# Patient Record
Sex: Female | Born: 1970
Health system: Southern US, Community
[De-identification: ages and names within clinical notes are randomized; demographics above are authoritative.]

## PROBLEM LIST (undated history)

## (undated) DIAGNOSIS — E079 Disorder of thyroid, unspecified: Secondary | ICD-10-CM

## (undated) DIAGNOSIS — M1990 Unspecified osteoarthritis, unspecified site: Secondary | ICD-10-CM

## (undated) DIAGNOSIS — I10 Essential (primary) hypertension: Secondary | ICD-10-CM

## (undated) HISTORY — PX: TONSILLECTOMY AND ADENOIDECTOMY: SUR1326

## (undated) HISTORY — PX: CHOLECYSTECTOMY: SHX55

## (undated) HISTORY — DX: Unspecified osteoarthritis, unspecified site: M19.90

## (undated) HISTORY — DX: Disorder of thyroid, unspecified: E07.9

---

## 2013-12-01 ENCOUNTER — Ambulatory Visit (INDEPENDENT_AMBULATORY_CARE_PROVIDER_SITE_OTHER): Payer: 59 | Admitting: Physician Assistant

## 2013-12-01 ENCOUNTER — Ambulatory Visit: Payer: 59

## 2013-12-01 VITALS — BP 156/88 | HR 67 | Temp 98.7°F | Resp 18 | Ht 63.5 in | Wt 304.0 lb

## 2013-12-01 DIAGNOSIS — R05 Cough: Secondary | ICD-10-CM

## 2013-12-01 DIAGNOSIS — R059 Cough, unspecified: Secondary | ICD-10-CM

## 2013-12-01 DIAGNOSIS — E039 Hypothyroidism, unspecified: Secondary | ICD-10-CM | POA: Insufficient documentation

## 2013-12-01 DIAGNOSIS — R062 Wheezing: Secondary | ICD-10-CM

## 2013-12-01 DIAGNOSIS — J329 Chronic sinusitis, unspecified: Secondary | ICD-10-CM

## 2013-12-01 IMAGING — CR DG CHEST 2V
3 series · 3 of 3 positions shown · non-contrast
Comparison: None.

CLINICAL DATA: Chest tightness

EXAM:
CHEST  2 VIEW

[PA (1 of 2)]
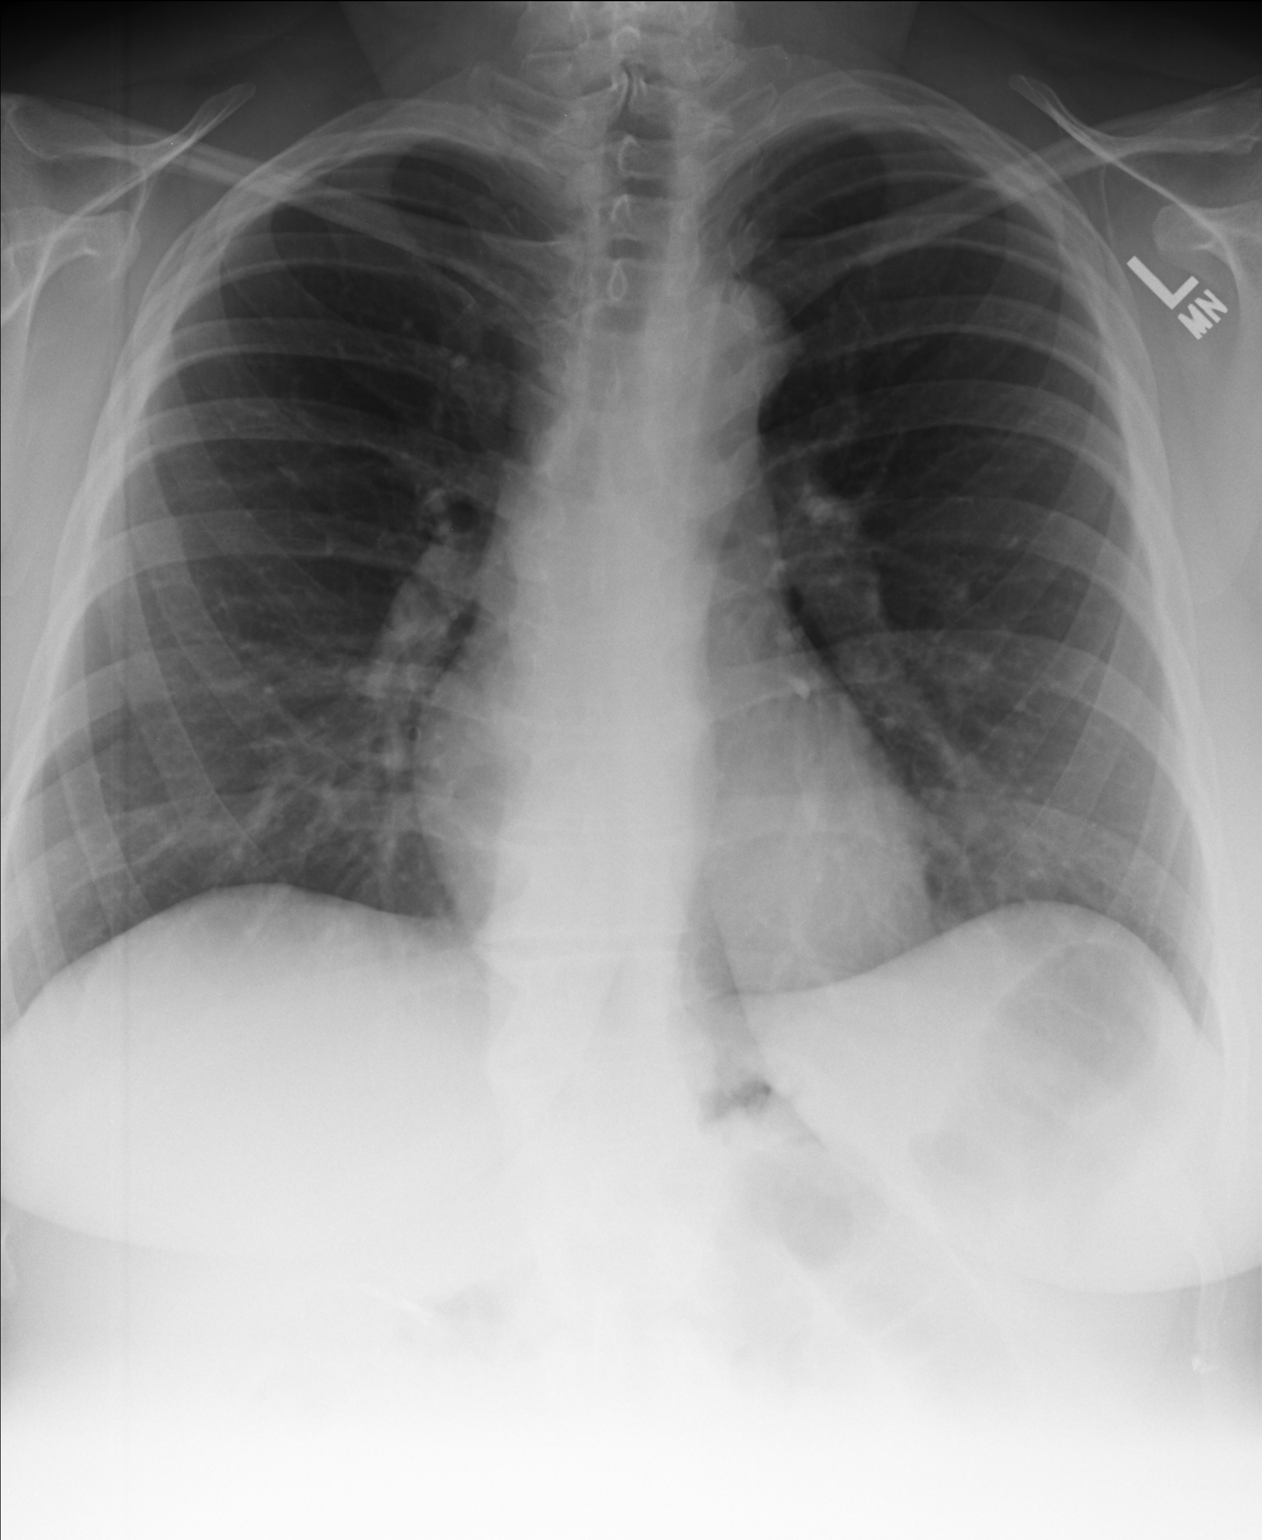

[lateral]
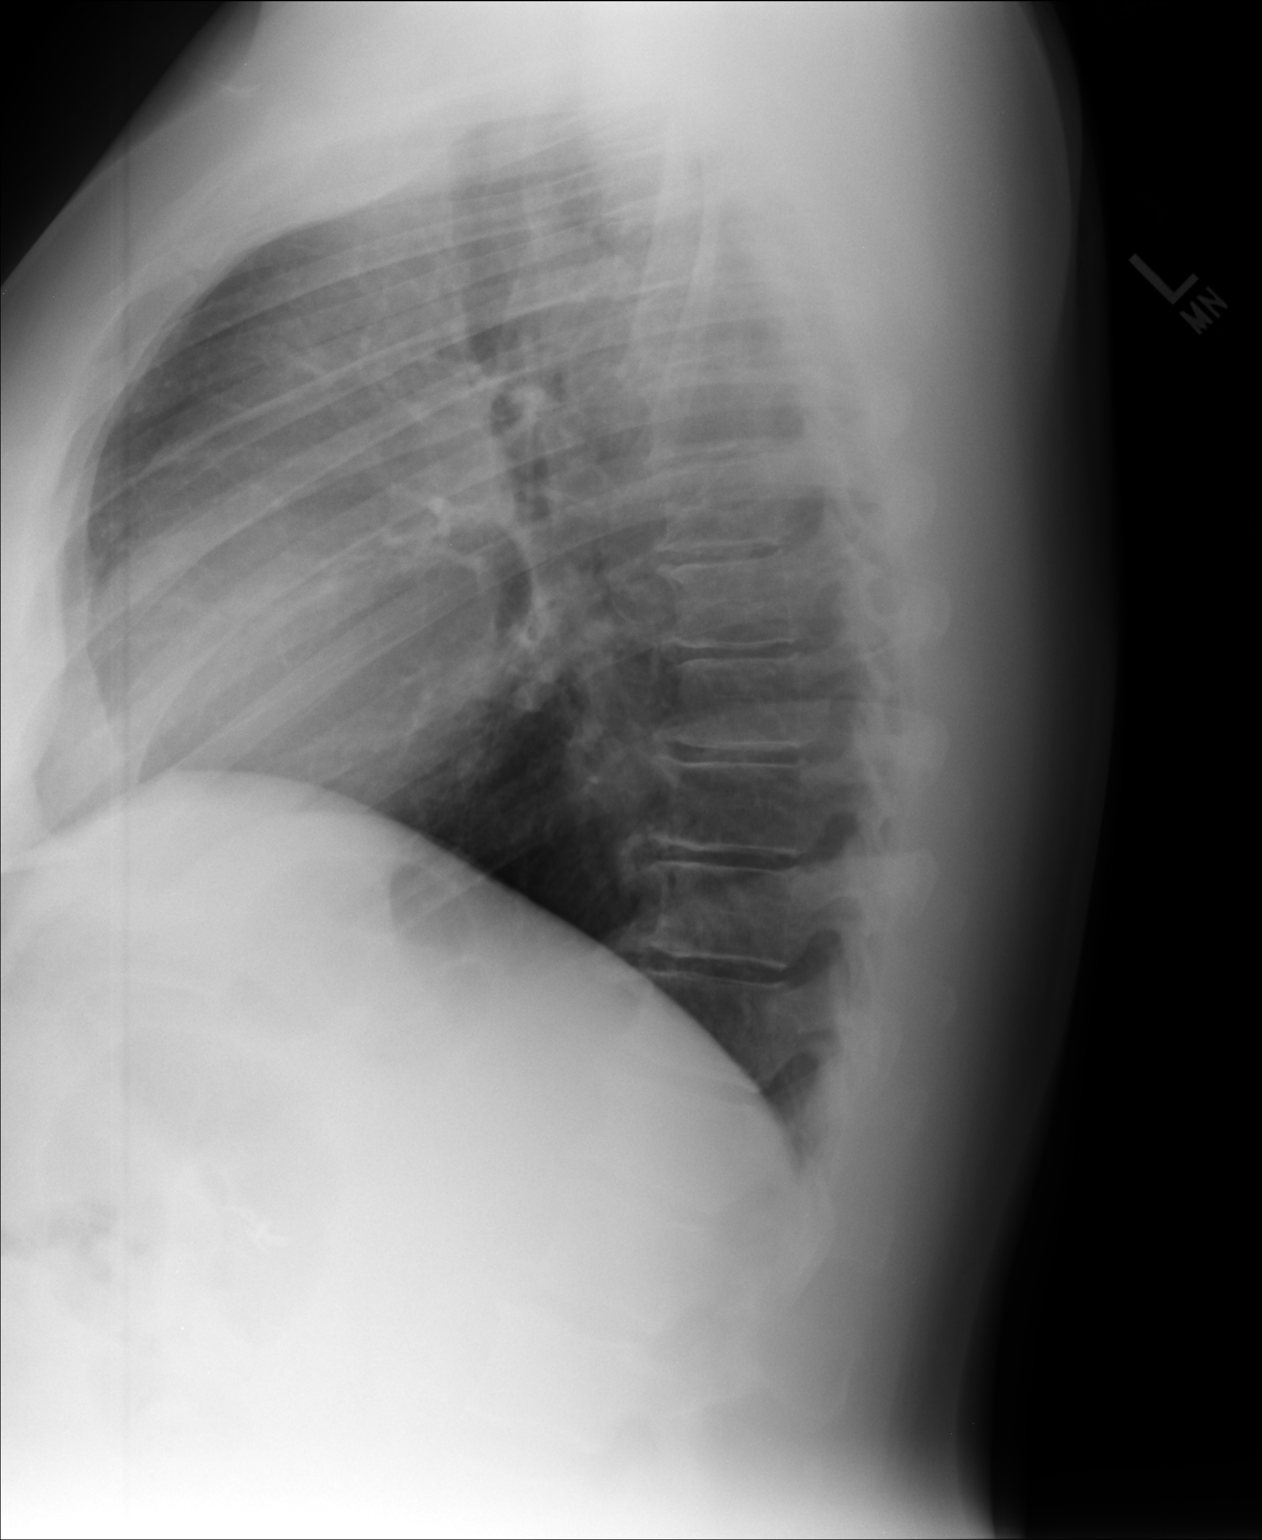

[PA (2 of 2)]
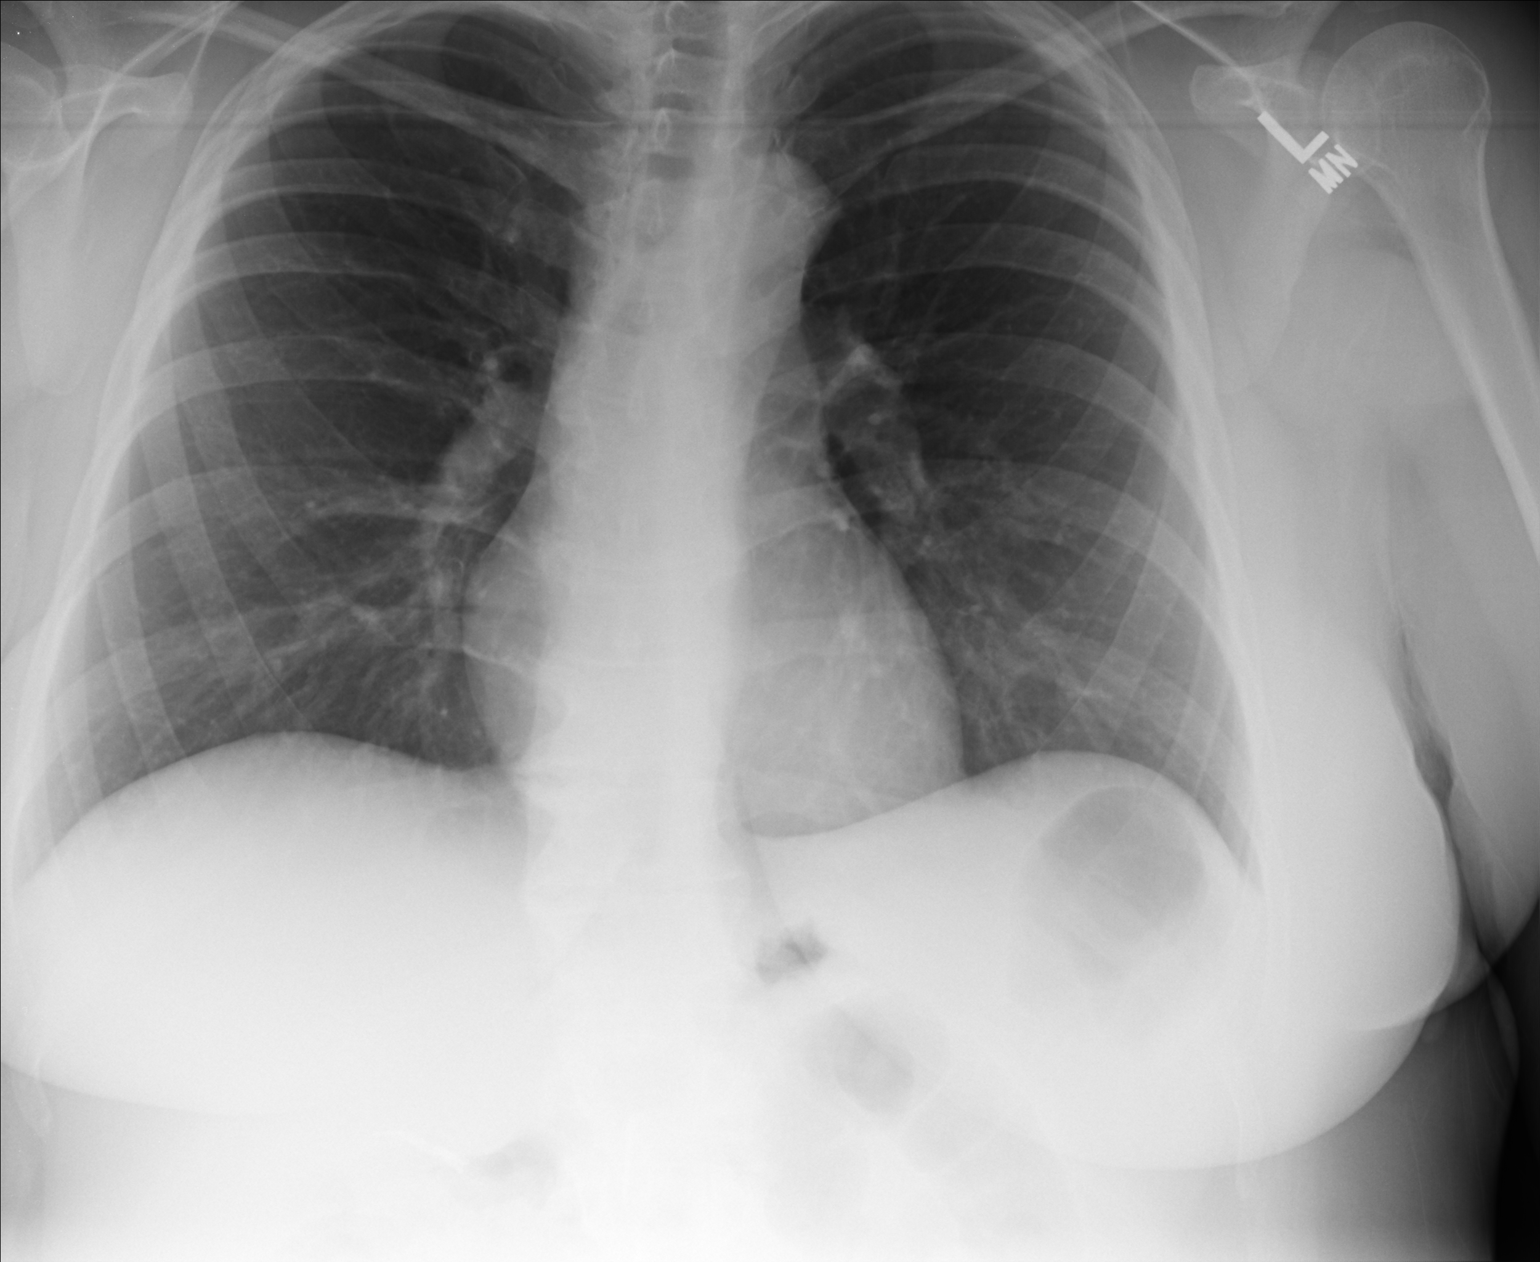

[3 of 3 positions shown; findings below may reference images not displayed]

FINDINGS: The heart size and mediastinal contours are within normal limits.
Both lungs are clear. The visualized skeletal structures are
unremarkable. Prior cholecystectomy.
IMPRESSION: No active cardiopulmonary disease.

## 2013-12-01 MED ORDER — MOXIFLOXACIN HCL 400 MG PO TABS: 400.0000 mg | ORAL_TABLET | Freq: Every day | ORAL | Status: DC

## 2013-12-01 MED ORDER — ALBUTEROL SULFATE (2.5 MG/3ML) 0.083% IN NEBU
2.5000 mg | INHALATION_SOLUTION | Freq: Once | RESPIRATORY_TRACT | Status: AC
  Administered 2013-12-01: 2.5 mg via RESPIRATORY_TRACT

## 2013-12-01 MED ORDER — HYDROCOD POLST-CHLORPHEN POLST 10-8 MG/5ML PO LQCR
5.0000 mL | Freq: Two times a day (BID) | ORAL | Status: DC | PRN
Start: 2013-12-01 — End: 2015-04-23

## 2013-12-01 MED ORDER — IPRATROPIUM BROMIDE 0.02 % IN SOLN
0.5000 mg | Freq: Once | RESPIRATORY_TRACT | Status: AC
  Administered 2013-12-01: 0.5 mg via RESPIRATORY_TRACT

## 2013-12-01 MED ORDER — ALBUTEROL SULFATE HFA 108 (90 BASE) MCG/ACT IN AERS
2.0000 | INHALATION_SPRAY | RESPIRATORY_TRACT | Status: DC | PRN
Start: 2013-12-01 — End: 2013-12-01

## 2013-12-01 MED ORDER — ALBUTEROL SULFATE HFA 108 (90 BASE) MCG/ACT IN AERS: 2.0000 | INHALATION_SPRAY | RESPIRATORY_TRACT | Status: DC | PRN

## 2013-12-01 NOTE — Progress Notes (Signed)
Subjective:    Patient ID: Emily Craig, female    DOB: May 17, 1971, 43 y.o.   MRN: [id number]  PCP: No PCP Per Patient  Chief Complaint  Patient presents with  . Sinusitis    around new years was given z pack but never got better   . Cough  . Otalgia  . chest congestion  . Sore Throat  . Headache     Active Ambulatory Problems    Diagnosis Date Noted  . Hypothyroidism 12/01/2013   Resolved Ambulatory Problems    Diagnosis Date Noted  . No Resolved Ambulatory Problems   Past Medical History  Diagnosis Date  . Thyroid disease     Past Surgical History  Procedure Laterality Date  . Cholecystectomy      Allergies  Allergen Reactions  . Penicillins     Unknown reaction  . Sulfa Antibiotics Rash    Prior to Admission medications   Medication Sig Start Date End Date Taking? Authorizing Provider  levothyroxine (SYNTHROID, LEVOTHROID) 175 MCG tablet Take 175 mcg by mouth daily before breakfast.   Yes Historical Provider, MD    History   Social History  . Marital Status: Married    Spouse Name: [name]    Number of Children: 3  . Years of Education: N/A   Occupational History  . 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 Smoking status: Never Smoker   . Smokeless tobacco: Never Used  . Alcohol Use: No  . Drug Use: No  . Sexual Activity: None   Other Topics Concern  . None   Social History Narrative   Lives with her husband and their daughter.  Their sons (twins) stayed in [place] when they moved here June 2014.    family history includes Aortic aneurysm in her mother; Cancer in her maternal grandfather; Diabetes in her maternal grandmother; Heart disease in her father and maternal grandmother; Hyperlipidemia in her brother; Hypertension in her brother and mother; Stroke in her paternal grandmother. indicated that her mother is alive. She indicated that her father is deceased. She indicated that her brother is alive. She  indicated that her maternal grandmother is deceased. She indicated that her maternal grandfather is deceased. She indicated that her paternal grandmother is deceased. She indicated that her paternal grandfather is deceased. She indicated that her daughter is alive. She indicated that both of her sons are alive.   HPI  As above.  Was visiting in [place] when developed a cold.  On 11/10/2013 clearly had a sinusitis, and was prescribed a Zpak.  She started feeling better, thgouh she was still tired.  The following week, she developed ear pain and sore throat, which resolved spontaneously without treatment.  Started to feel bad again 5 days ago.  Feels like a bronchospasm.  Causes sore throat.  Non-productive.  At times, can hear wheezing. Ears hurt.  Trouble sleeping due to cough. Now also has facial pain again.  Minimal rhinorrhea, clear.  Some laryngitis today and yesterday.  Headache. Some chills initially.  None since, and no fever. No GI/GU symptoms.   Review of Systems As above.    Objective:   Physical Exam  Vitals reviewed. Constitutional: She is oriented to person, place, and time. Vital signs are normal. She appears well-developed and well-nourished. No distress.  HENT:  Head: Normocephalic and atraumatic.  Right Ear: Hearing, tympanic membrane, external ear and ear canal normal.  Left Ear: Hearing, tympanic membrane, external  ear and ear canal normal.  Nose: Mucosal edema and rhinorrhea present.  No foreign bodies. Right sinus exhibits maxillary sinus tenderness and frontal sinus tenderness. Left sinus exhibits maxillary sinus tenderness and frontal sinus tenderness.  Mouth/Throat: Uvula is midline, oropharynx is clear and moist and mucous membranes are normal. No uvula swelling. No oropharyngeal exudate.  Eyes: Conjunctivae and EOM are normal. Pupils are equal, round, and reactive to light. Right eye exhibits no discharge. Left eye exhibits no discharge. No scleral icterus.  Neck:  Trachea normal, normal range of motion and full passive range of motion without pain. Neck supple. No mass and no thyromegaly present.  Cardiovascular: Normal rate, regular rhythm and normal heart sounds.   Pulmonary/Chest: Effort normal. She has no decreased breath sounds. She has no wheezes. She has rhonchi in the left upper field. She has no rales.  Rhonchi in the LUF resolved after nebulized treatment with albuterol + Atrovent. Subjective symptoms also improved.  Lymphadenopathy:       Head (right side): No submandibular, no tonsillar, no preauricular, no posterior auricular and no occipital adenopathy present.       Head (left side): No submandibular, no tonsillar, no preauricular and no occipital adenopathy present.    She has no cervical adenopathy.       Right: No supraclavicular adenopathy present.       Left: No supraclavicular adenopathy present.  Neurological: She is alert and oriented to person, place, and time. She has normal strength. No cranial nerve deficit or sensory deficit.  Skin: Skin is warm, dry and intact. No rash noted.  Psychiatric: She has a normal mood and affect. Her speech is normal and behavior is normal.      CXR: [facility] reading (PRIMARY) by  Dr. Laney Pastor. Normal chest films.  No infiltrate, no pneumothorax, no bony abnormalities.      Assessment & Plan:  1. Cough - DG Chest 2 View; Future - chlorpheniramine-HYDROcodone (TUSSIONEX PENNKINETIC ER) 10-8 MG/5ML LQCR; Take 5 mLs by mouth every 12 (twelve) hours as needed for cough (cough).  Dispense: 100 mL; Refill: 0  2. Sinusitis PCN and Sulfa allergic. - moxifloxacin (AVELOX) 400 MG tablet; Take 1 tablet (400 mg total) by mouth daily.  Dispense: 10 tablet; Refill: 0  3. Wheezing - albuterol (PROVENTIL) (2.5 MG/3ML) 0.083% nebulizer solution 2.5 mg; Take 3 mLs (2.5 mg total) by nebulization once. - ipratropium (ATROVENT) nebulizer solution 0.5 mg; Take 2.5 mLs (0.5 mg total) by nebulization once. -  albuterol (PROVENTIL HFA;VENTOLIN HFA) 108 (90 BASE) MCG/ACT inhaler; Inhale 2 puffs into the lungs every 4 (four) hours as needed for wheezing or shortness of breath (cough, shortness of breath or wheezing.).  Dispense: 1 Inhaler; Refill: 1   Fara Chute, PA-C Physician Assistant-Certified [facility]

## 2013-12-01 NOTE — Patient Instructions (Signed)
Get plenty of rest and drink at least 64 ounces of water daily. 

## 2014-01-22 ENCOUNTER — Other Ambulatory Visit: Payer: Self-pay

## 2014-08-07 ENCOUNTER — Other Ambulatory Visit: Payer: Self-pay | Admitting: Obstetrics

## 2014-08-07 DIAGNOSIS — Z1231 Encounter for screening mammogram for malignant neoplasm of breast: Secondary | ICD-10-CM

## 2014-08-18 LAB — HM PAP SMEAR

## 2014-08-18 LAB — HM MAMMOGRAPHY: HM MAMMO: NORMAL (ref 0–4)

## 2014-08-26 ENCOUNTER — Ambulatory Visit
Admission: RE | Admit: 2014-08-26 | Discharge: 2014-08-26 | Disposition: A | Discharge status: Home / Self Care | Payer: 59 | Source: Ambulatory Visit | Attending: Obstetrics | Admitting: Obstetrics

## 2014-08-26 DIAGNOSIS — Z1231 Encounter for screening mammogram for malignant neoplasm of breast: Secondary | ICD-10-CM

## 2014-09-03 ENCOUNTER — Ambulatory Visit: Payer: Self-pay

## 2015-04-23 ENCOUNTER — Telehealth: Payer: Self-pay | Admitting: Radiology

## 2015-04-23 ENCOUNTER — Ambulatory Visit (INDEPENDENT_AMBULATORY_CARE_PROVIDER_SITE_OTHER): Payer: 59 | Admitting: Family Medicine

## 2015-04-23 ENCOUNTER — Ambulatory Visit (HOSPITAL_COMMUNITY)
Admission: RE | Admit: 2015-04-23 | Discharge: 2015-04-23 | Disposition: A | Discharge status: Home / Self Care | Payer: 59 | Source: Ambulatory Visit | Attending: Physician Assistant | Admitting: Physician Assistant

## 2015-04-23 ENCOUNTER — Encounter (HOSPITAL_COMMUNITY): Payer: Self-pay

## 2015-04-23 VITALS — BP 138/100 | HR 74 | Temp 98.0°F | Resp 16 | Wt 305.6 lb

## 2015-04-23 DIAGNOSIS — R1110 Vomiting, unspecified: Secondary | ICD-10-CM | POA: Insufficient documentation

## 2015-04-23 DIAGNOSIS — J341 Cyst and mucocele of nose and nasal sinus: Secondary | ICD-10-CM | POA: Insufficient documentation

## 2015-04-23 DIAGNOSIS — R51 Headache: Secondary | ICD-10-CM | POA: Diagnosis present

## 2015-04-23 DIAGNOSIS — R519 Headache, unspecified: Secondary | ICD-10-CM

## 2015-04-23 DIAGNOSIS — R61 Generalized hyperhidrosis: Secondary | ICD-10-CM

## 2015-04-23 DIAGNOSIS — R112 Nausea with vomiting, unspecified: Secondary | ICD-10-CM

## 2015-04-23 HISTORY — DX: Essential (primary) hypertension: I10

## 2015-04-23 IMAGING — CT CT HEAD W/O CM
1 of 2 series · 16 of 30 positions shown, 20 images · non-contrast
Comparison: None.

CLINICAL DATA: Acute onset severe headache and vomiting

EXAM:
CT HEAD WITHOUT CONTRAST
TECHNIQUE: Contiguous axial images were obtained from the base of the skull
through the vertex without intravenous contrast.

[Series 3: head 2.0 h70h · axial · 0.44mm/px · z∈[+1440,+1586]mm · 16 of 83 slices shown, 20 images]
[im 5/83  brain]
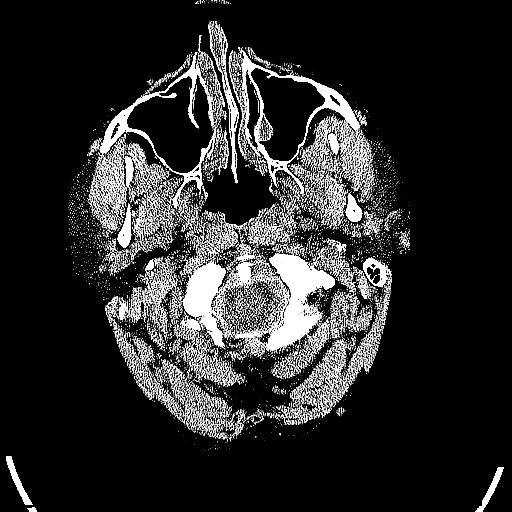
[im 5/83  bone]
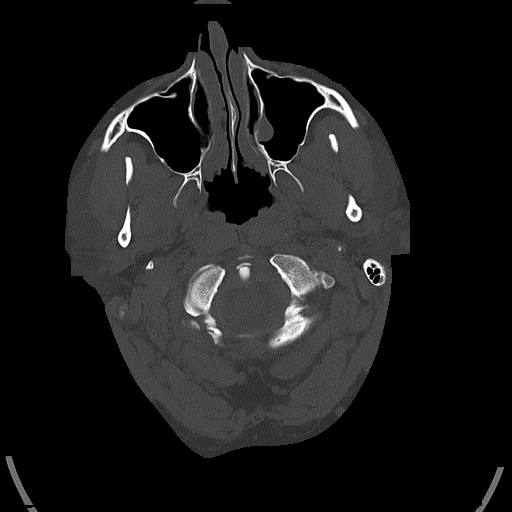
[im 9/83  brain]
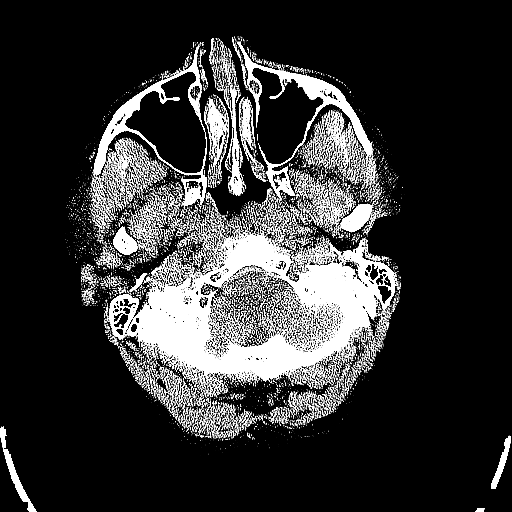
[im 13/83  brain]
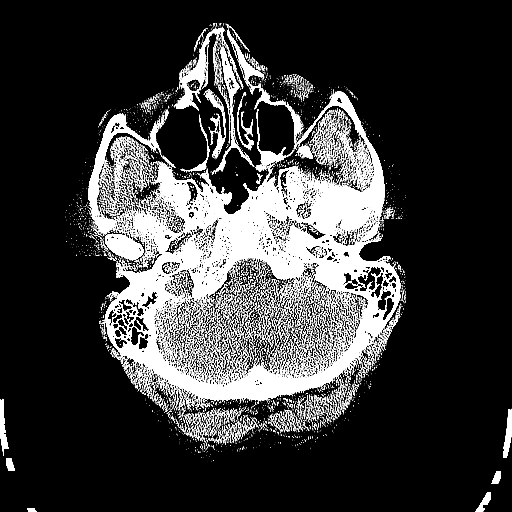
[im 21/83  brain]
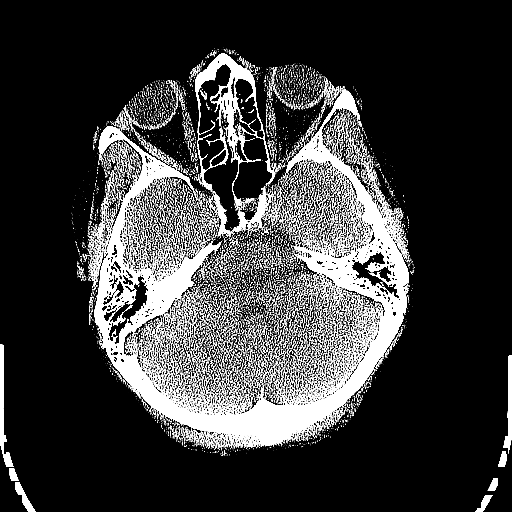
[im 25/83  brain]
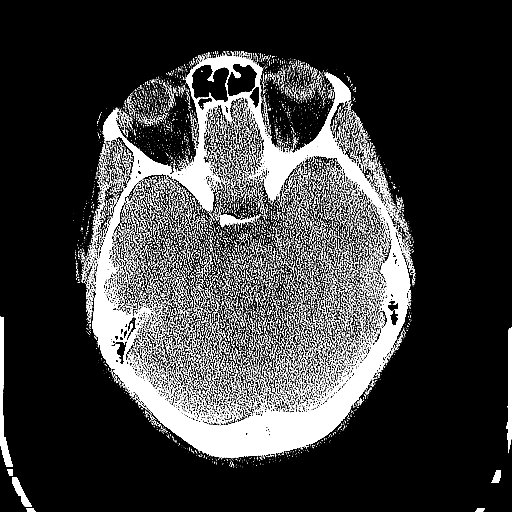
[im 25/83  bone]
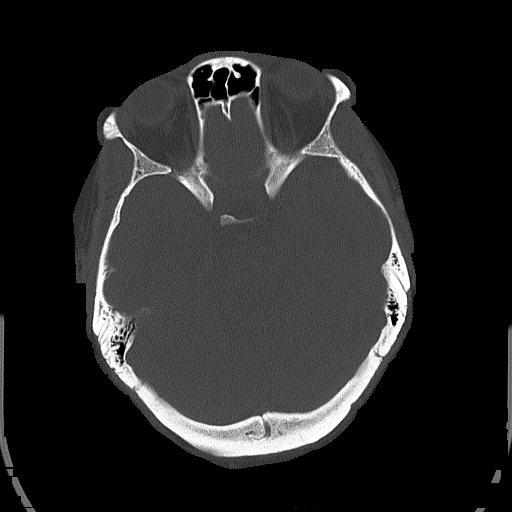
[im 29/83  brain]
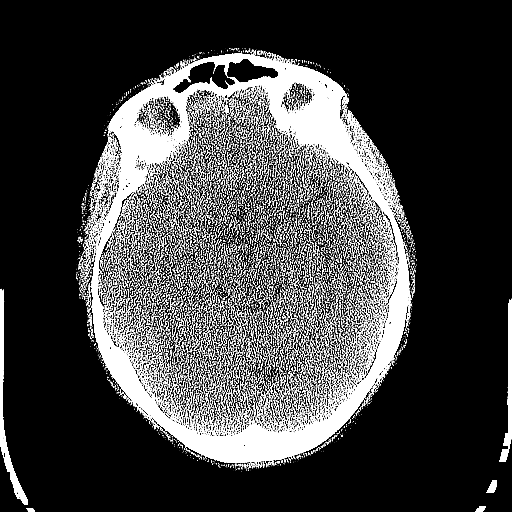
[im 33/83  brain]
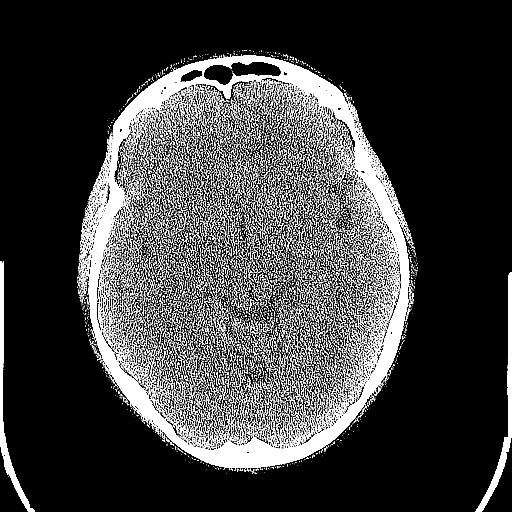
[im 37/83  brain]
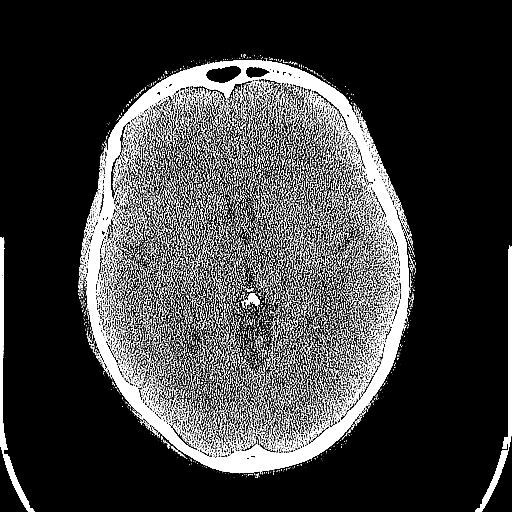
[im 46/83  brain]
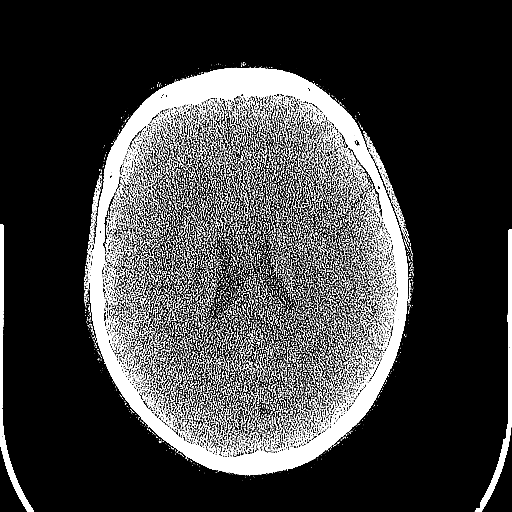
[im 46/83  bone]
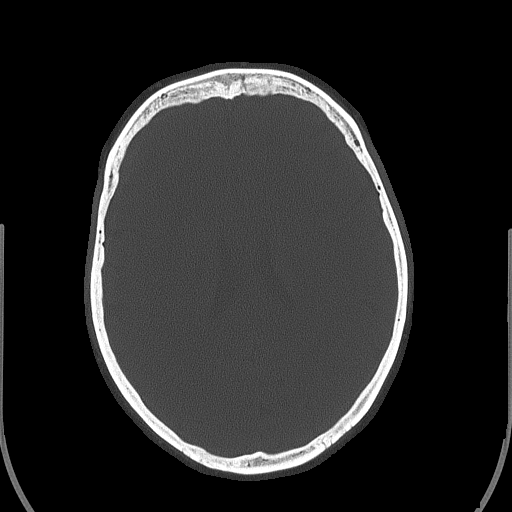
[im 50/83  brain]
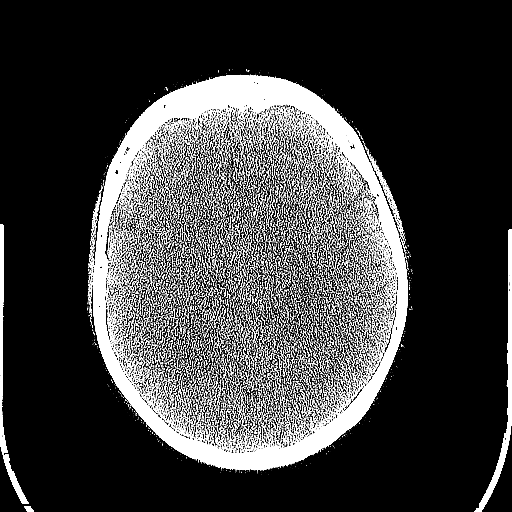
[im 54/83  brain]
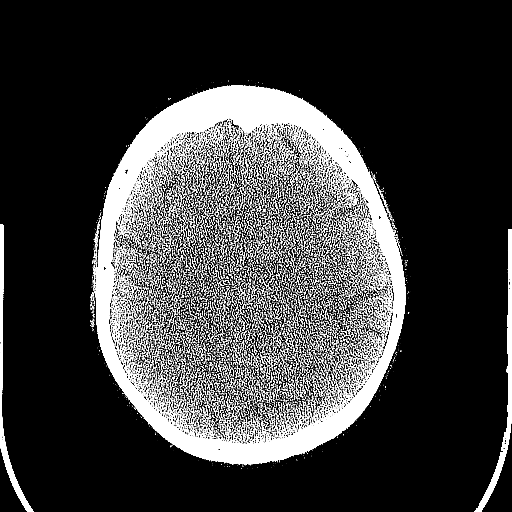
[im 58/83  brain]
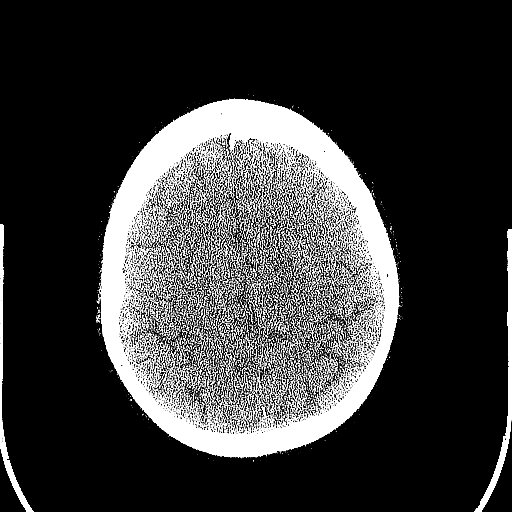
[im 62/83  brain]
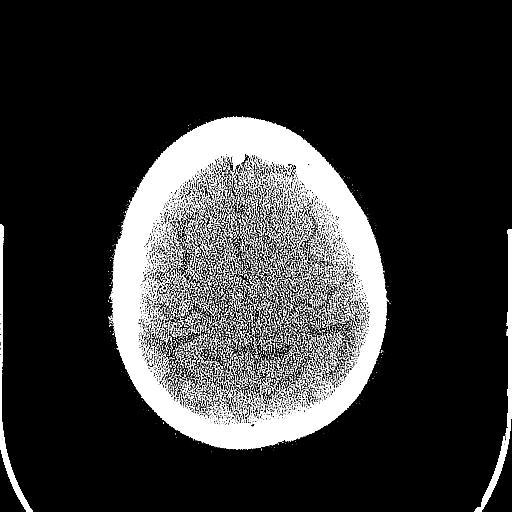
[im 62/83  bone]
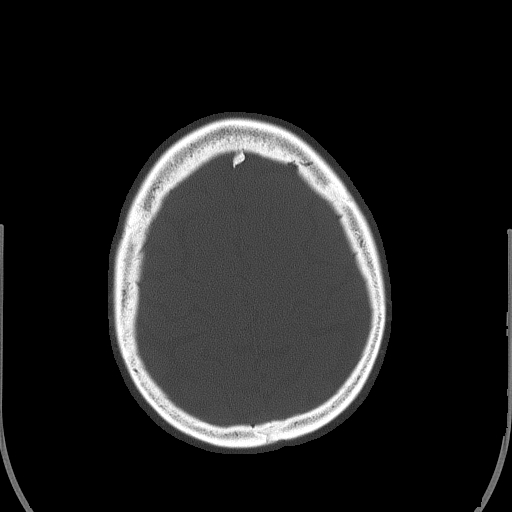
[im 70/83  brain]
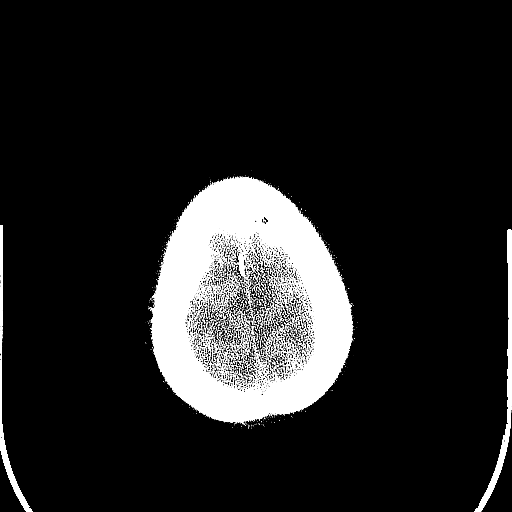
[im 74/83  brain]
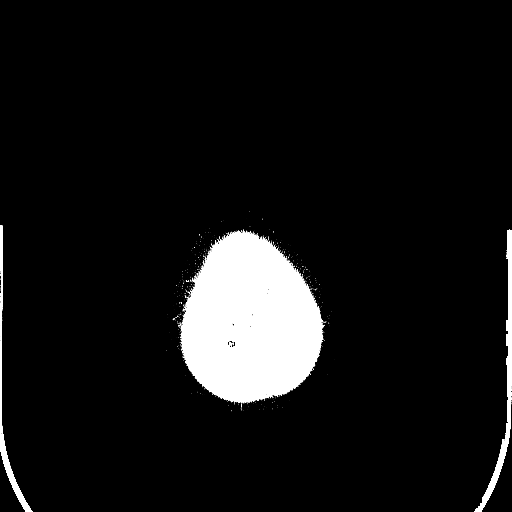
[im 78/83  brain]
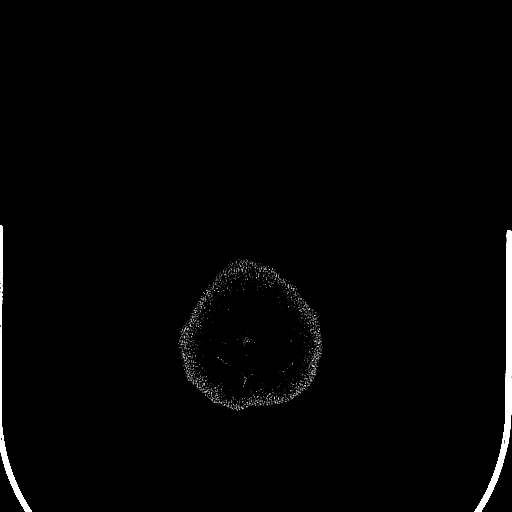

[16 of 30 positions shown; findings below may reference images not displayed]

FINDINGS: The ventricles are normal in size and configuration. There is no
intracranial mass, hemorrhage, extra-axial fluid collection, or
midline shift. The gray-white compartments are normal. There is no
evident acute infarct. The bony calvarium appears intact. The
mastoid air cells are clear. There is a small retention cyst in the
medial left maxillary antrum.
IMPRESSION: Small retention cyst in the left maxillary antrum medially. No
intracranial mass, hemorrhage, or focal gray -white compartment
lesions/acute appearing infarct.

## 2015-04-23 MED ORDER — ONDANSETRON 4 MG PO TBDP
4.0000 mg | ORAL_TABLET | Freq: Once | ORAL | Status: AC
  Administered 2015-04-23: 4 mg via ORAL

## 2015-04-23 NOTE — Patient Instructions (Addendum)
We need to rule out a subarachnoid hemorrhage. We gave you 4 mg zofran today to help with the nausea. If the scan is normal feel free to come back to see [place] for Toradol injection which will help with the headache.  Please go to [facility] as an out patient for Ct Scan of the Head at 1:00 pm today

## 2015-04-23 NOTE — Telephone Encounter (Signed)
Spoke with patient and gave normal CT results.  Patient understands and will follow up as discussed.

## 2015-04-23 NOTE — Progress Notes (Signed)
   Subjective:    Patient ID: Emily Craig, female    DOB: 02-22-1971, 44 y.o.   MRN: [id number]  Chief Complaint  Patient presents with  . Headache    started this am  . nausea with vomiting this am  . sweating   Patient Active Problem List   Diagnosis Date Noted  . Hypothyroidism 12/01/2013   Prior to Admission medications   Medication Sig Start Date End Date Taking? Authorizing Provider  levothyroxine (SYNTHROID, LEVOTHROID) 175 MCG tablet Take 175 mcg by mouth daily before breakfast.   Yes Historical Provider, MD  albuterol (PROVENTIL HFA;VENTOLIN HFA) 108 (90 BASE) MCG/ACT inhaler Inhale 2 puffs into the lungs every 4 (four) hours as needed for wheezing or shortness of breath (cough, shortness of breath or wheezing.). Patient not taking: Reported on 04/23/2015 12/01/13   Harrison Mons, PA-C   Medications, allergies, past medical history, surgical history, family history, social history and problem list reviewed and updated.  HPI  44 yof presents with HA, n/v, diaphoresis.   Awoke this am with 10/10 HA which is worst of her life. Laid in bed for awhile but no relief so came here. Nauseated upon getting up. One episode vomiting on her way to [facility]. Diaphoretic and shaky on way to clinic. Took excedrin migraine with no relief.   Has hx migraines but this is different and worse.   Denies recent tick exposures. No chills. Denies abd pain, cp, sob, vision changes. Denies diarrhea.  Review of Systems See HPI.     Objective:   Physical Exam  Constitutional: She is oriented to person, place, and time. She appears well-developed and well-nourished.  Non-toxic appearance. She does not have a sickly appearance. She does not appear ill. No distress.  BP 138/100 mmHg  Pulse 74  Temp(Src) 98 F (36.7 C) (Oral)  Resp 16  Wt 305 lb 9.6 oz (138.619 kg)  SpO2 98%  LMP 04/18/2015   Eyes: Conjunctivae and EOM are normal. Pupils are equal, round, and reactive to light.  Neck: No  rigidity. No Brudzinski's sign noted.  Neurological: She is alert and oriented to person, place, and time. She has normal strength. No cranial nerve deficit or sensory deficit. She displays a negative Romberg sign.  Psychiatric: She has a normal mood and affect. Her speech is normal.      Assessment & Plan:   44 yof presents with HA, n/v, diaphoresis.   Worst headache of life - Plan: CT Head Wo Contrast Nausea and vomiting, vomiting of unspecified type - Plan: ondansetron (ZOFRAN-ODT) disintegrating tablet 4 mg Diaphoresis --concern for SAH with HA presentation --to [facility] for ncct head - they will contact us with results --zofran odt for nausea --pt instructed she can rtc if ct scan ok for further workup including possible Toradol and labs --aunt drove her here and will be transporting her to hospital  Julieta Gutting, PA-C Physician Assistant-Certified [facility]  04/23/2015 11:53 AM

## 2015-07-05 ENCOUNTER — Ambulatory Visit (INDEPENDENT_AMBULATORY_CARE_PROVIDER_SITE_OTHER): Payer: 59 | Admitting: Physician Assistant

## 2015-07-05 VITALS — BP 130/90 | HR 91 | Temp 98.7°F | Resp 18 | Ht 65.5 in | Wt 310.0 lb

## 2015-07-05 DIAGNOSIS — K219 Gastro-esophageal reflux disease without esophagitis: Secondary | ICD-10-CM

## 2015-07-05 DIAGNOSIS — T7840XA Allergy, unspecified, initial encounter: Secondary | ICD-10-CM | POA: Diagnosis not present

## 2015-07-05 DIAGNOSIS — IMO0001 Reserved for inherently not codable concepts without codable children: Secondary | ICD-10-CM | POA: Insufficient documentation

## 2015-07-05 MED ORDER — METHYLPREDNISOLONE SODIUM SUCC 125 MG IJ SOLR
125.0000 mg | Freq: Once | INTRAMUSCULAR | Status: AC
  Administered 2015-07-05: 125 mg via INTRAVENOUS

## 2015-07-05 MED ORDER — CETIRIZINE HCL 10 MG PO TABS
10.0000 mg | ORAL_TABLET | Freq: Once | ORAL | Status: AC
  Administered 2015-07-05: 10 mg via ORAL

## 2015-07-05 MED ORDER — RANITIDINE HCL 150 MG PO TABS
150.0000 mg | ORAL_TABLET | Freq: Once | ORAL | Status: AC
  Administered 2015-07-05: 150 mg via ORAL

## 2015-07-05 MED ORDER — PREDNISONE 20 MG PO TABS: ORAL_TABLET | ORAL | Status: DC

## 2015-07-05 MED ORDER — EPINEPHRINE 0.3 MG/0.3ML IJ SOAJ: 0.3000 mg | Freq: Once | INTRAMUSCULAR | Status: DC

## 2015-07-05 NOTE — Patient Instructions (Signed)
Continue either Claritin or Zyrtec daily for the next week. You may supplement with Benadryl as needed. Also continue the Zantac every day. Stay as cool and dry as you can, as getting hot and sweaty can increase the itching.

## 2015-07-05 NOTE — Progress Notes (Signed)
Patient ID: Emily Craig, female    DOB: 13-Sep-1971, 44 y.o.   MRN: [id number]  PCP: No PCP Per Patient  Subjective:   Chief Complaint  Patient presents with  . Allergic Reaction    to peanut - swelling, skin rash, feel like a nut in her throat x last night    HPI Presents for evaluation of allergic reaction.  2 days ago developed itchy rash under both upper arms. Hives. Yesterday, developed a rough sensation on the inside of the cheeks, then her lips became tingly and swollen, along with her lower face, LEFT>RIGHT. No tongue symptoms. LEFT eye became itchy and swollen. Took OTC Benadryl at 8 pm, 12 midnight and again at 8 am today. Woke up today with the sensation of a lump in her throat. Also has a voice change today. Went on to work this morning and her colleagues sent her to be evaluated.  Felt a little tight in her chest while in our waiting room, but none since. Not SOB. No respiratory symptoms. No Nausea/vomiting. No recent illness. No new products, pets or recent travel. Thinks that the reaction may be to almonds that she eats regularly, though she opened a new bag the day her symptoms began, and they were chocolate covered.   Takes Zantac daily for reflux symptoms, but hasn't had a dose yet today. This feels different.  Review of Systems  Constitutional: Negative.   HENT: Positive for facial swelling and voice change. Negative for ear discharge, ear pain, mouth sores, postnasal drip, rhinorrhea, sinus pressure, sneezing, sore throat and trouble swallowing.   Eyes: Positive for itching. Negative for photophobia, pain, discharge, redness and visual disturbance.  Respiratory: Positive for chest tightness. Negative for cough, choking, shortness of breath, wheezing and stridor.   Cardiovascular: Negative for chest pain and palpitations.  Gastrointestinal: Negative for nausea, vomiting and diarrhea.  Skin: Positive for rash.  Neurological: Negative for dizziness,  tremors, weakness and headaches.       Patient Active Problem List   Diagnosis Date Noted  . Reflux 07/05/2015  . Hypothyroidism 12/01/2013     Prior to Admission medications   Medication Sig Start Date End Date Taking? Authorizing Provider  levothyroxine (SYNTHROID, LEVOTHROID) 200 MCG tablet Take 200 mcg by mouth daily before breakfast.   Yes Historical Provider, MD     Allergies  Allergen Reactions  . Penicillins     Unknown reaction  . Sulfa Antibiotics Rash       Objective:  Physical Exam  Constitutional: She is oriented to person, place, and time. She appears well-developed and well-nourished. She is active and cooperative. No distress.  BP 140/100 mmHg  Pulse 91  Temp(Src) 98.7 F (37.1 C) (Oral)  Resp 18  Ht 5' 5.5" (1.664 m)  Wt 310 lb (140.615 kg)  BMI 50.78 kg/m2  SpO2 98%   HENT:  Head: Normocephalic and atraumatic.  Right Ear: Hearing, tympanic membrane, external ear and ear canal normal.  Left Ear: Hearing, tympanic membrane, external ear and ear canal normal.  Nose: Nose normal.  Mouth/Throat: Uvula is midline and mucous membranes are normal. Normal dentition.  Eyes: Conjunctivae are normal. Pupils are equal, round, and reactive to light. No scleral icterus.  Mild swelling of the LEFT eyelid  Neck: Full passive range of motion without pain and phonation normal. No thyromegaly present.  Cardiovascular: Normal rate, regular rhythm, normal heart sounds and intact distal pulses.   Pulmonary/Chest: Effort normal and breath sounds normal.  Lymphadenopathy:  She has no cervical adenopathy.  Neurological: She is alert and oriented to person, place, and time.  Skin: Skin is warm and dry. Rash noted. Rash is urticarial.     Psychiatric: She has a normal mood and affect. Her speech is normal and behavior is normal.    Given cetirizine 10 mg and ranitidine 150 mg PO x 1 in the office. IV NS started and 125 mg Solumedrol administered.  The patient  was monitored as the symptoms resolved.       Assessment & Plan:   1. Allergic reaction, initial encounter Uncertain etiology. Referral to allergy. Continue OTC oral antihistamine and H2 blocker for the next week. Consider increasing ranitidine to BID, given her baseline reflux symptoms and the addition of prednisone. Epi-Pen in the event that she develops recurrent or new symptoms that involve the mouth, lips, throat. - cetirizine (ZYRTEC) tablet 10 mg; Take 1 tablet (10 mg total) by mouth once. - ranitidine (ZANTAC) tablet 150 mg; Take 1 tablet (150 mg total) by mouth once. - methylPREDNISolone sodium succinate (SOLU-MEDROL) 125 mg/2 mL injection 125 mg; Inject 2 mLs (125 mg total) into the vein once. - EPINEPHrine 0.3 mg/0.3 mL IJ SOAJ injection; Inject 0.3 mLs (0.3 mg total) into the muscle once.  Dispense: 2 Device; Refill: 1 - predniSONE (DELTASONE) 20 MG tablet; Take 3 PO QAM x3days, 2 PO QAM x3days, 1 PO QAM x3days  Dispense: 18 tablet; Refill: 0 - Ambulatory referral to Allergy   Fara Chute, PA-C Physician Assistant-Certified [facility]

## 2015-07-15 ENCOUNTER — Other Ambulatory Visit (INDEPENDENT_AMBULATORY_CARE_PROVIDER_SITE_OTHER): Payer: 59 | Admitting: *Deleted

## 2015-07-15 ENCOUNTER — Ambulatory Visit (INDEPENDENT_AMBULATORY_CARE_PROVIDER_SITE_OTHER): Payer: 59 | Admitting: Family Medicine

## 2015-07-15 ENCOUNTER — Other Ambulatory Visit: Payer: Self-pay | Admitting: *Deleted

## 2015-07-15 VITALS — BP 140/90 | HR 87 | Temp 98.0°F | Resp 20 | Ht 66.0 in | Wt 310.4 lb

## 2015-07-15 DIAGNOSIS — L509 Urticaria, unspecified: Secondary | ICD-10-CM

## 2015-07-15 DIAGNOSIS — T7840XA Allergy, unspecified, initial encounter: Secondary | ICD-10-CM

## 2015-07-15 MED ORDER — PREDNISONE 20 MG PO TABS: ORAL_TABLET | ORAL | Status: DC

## 2015-07-15 NOTE — Progress Notes (Signed)
Chief Complaint:  Chief Complaint  Patient presents with  . Allergic Reaction    Hives along both arms, on her leg, on her face and swelling to her lips with redness x 3 days    HPI: Emily Craig is a 44 y.o. female who reports to [facility] today complaining of  Recurrent hives and recurrence of welts. She is not SOB or having CP or swelling of face. She has some tingling of her lips. Was here on 8/29 for allergic reaction to almond extract in shampoo? This is not as bad. No new foods but did eat a lot of chicken recently , she ahs PCN allergy. I am not sure if these foods are being pumped with abx . She has an allergist consultation 08/06/15. LAst visiit got solumedrol 125, then steroid taper, then zyrtec 10 mg daily zantac 150 mg BID. Last dose of steroid yesterday, woke up with hives today, she ahs not taken zyrtec until 2 hours ago and then zantac as well and her lips feel better  OV form 8/29 2 days ago developed itchy rash under both upper arms. Hives. Yesterday, developed a rough sensation on the inside of the cheeks, then her lips became tingly and swollen, along with her lower face, LEFT>RIGHT. No tongue symptoms. LEFT eye became itchy and swollen. Took OTC Benadryl at 8 pm, 12 midnight and again at 8 am today. Woke up today with the sensation of a lump in her throat. Also has a voice change today. Went on to work this morning and her colleagues sent her to be evaluated.  Felt a little tight in her chest while in our waiting room, but none since. Not SOB. No respiratory symptoms. No Nausea/vomiting. No recent illness. No new products, pets or recent travel. Thinks that the reaction may be to almonds that she eats regularly, though she opened a new bag the day her symptoms began, and they were chocolate covered.   Takes Zantac daily for reflux symptoms, but hasn't had a dose yet today. This feels different.  Past Medical History  Diagnosis Date  . Thyroid disease   .  Hypertension    Past Surgical History  Procedure Laterality Date  . Cholecystectomy     Social History   Social History  . Marital Status: Married    Spouse Name: [name]  . Number of Children: 3  . Years of Education: RN   Occupational History  . RN [employer]   Social History Main Topics  . Smoking status: Never Smoker   . Smokeless tobacco: Never Used  . Alcohol Use: No  . Drug Use: No  . Sexual Activity: Not Asked   Other Topics Concern  . None   Social History Narrative   Lives with her husband and their daughter.  Their sons (twins) stayed in [place] when they moved here June 2014.   Family History  Problem Relation Age of Onset  . Hypertension Mother   . Aortic aneurysm Mother   . Cancer Mother     pancreatic cancer  . Heart disease Father   . Hyperlipidemia Brother   . Hypertension Brother   . Diabetes Maternal Grandmother   . Heart disease Maternal Grandmother   . Cancer Maternal Grandfather   . Stroke Paternal Grandmother    Allergies  Allergen Reactions  . Penicillins     Unknown reaction  . Sulfa Antibiotics Rash   Prior to Admission medications  Medication Sig Start Date End Date Taking? Authorizing Provider  EPINEPHrine 0.3 mg/0.3 mL IJ SOAJ injection Inject 0.3 mLs (0.3 mg total) into the muscle once. 07/05/15  Yes Chelle Jeffery, PA-C  levothyroxine (SYNTHROID, LEVOTHROID) 200 MCG tablet Take 200 mcg by mouth daily before breakfast.   Yes Historical Provider, MD  ranitidine (ZANTAC) 150 MG tablet Take 150 mg by mouth daily.   Yes Historical Provider, MD  predniSONE (DELTASONE) 20 MG tablet Take 3 PO QAM x3days, 2 PO QAM x3days, 1 PO QAM x3days, 1/2 pill  PO x 3 days 07/15/15   Ilyanna Baillargeon P Cing [place], DO     ROS: The patient denies fevers, chills, night sweats, unintentional weight loss, chest pain, palpitations, wheezing, dyspnea on exertion, nausea, vomiting, abdominal pain, dysuria, hematuria, melena, numbness, weakness, or tingling.     All other systems have been reviewed and were otherwise negative with the exception of those mentioned in the HPI and as above.    PHYSICAL EXAM: Filed Vitals:   07/15/15 1935  BP: 140/90  Pulse: 87  Temp: 98 F (36.7 C)  Resp: 20   Body mass index is 50.12 kg/(m^2).   General: Alert, no acute distress. Morbidly obese HEENT:  Normocephalic, atraumatic, oropharynx patent. EOMI, PERRLA, no voice changes, no lip swelling Cardiovascular:  Regular rate and rhythm, no rubs murmurs or gallops.   Respiratory: Clear to auscultation bilaterally.  No wheezes, rales, or rhonchi.  No cyanosis, no use of accessory musculature Abdominal: No organomegaly, abdomen is soft and non-tender, positive bowel sounds. No masses. Skin: No rashes. Neurologic: Facial musculature symmetric. Psychiatric: Patient acts appropriately throughout our interaction. Lymphatic: No cervical or submandibular lymphadenopathy Musculoskeletal: Gait intact.    LABS: No results found for this or any previous visit.   EKG/XRAY:   Primary read interpreted by Dr. Marin Comment at [facility].   ASSESSMENT/PLAN: Encounter Diagnoses  Name Primary?  . Allergic reaction, initial encounter Yes  . Hives    Offered to give benadryl IM but she drove herself I think she will be fine with another extended steroid taper, take benadryl regular every 8 hours, zantac BID She has an epi pen,precautiosn given to go to ER   Gross sideeffects, risk and benefits, and alternatives of medications d/w patient. Patient is aware that all medications have potential sideeffects and we are unable to predict every sideeffect or drug-drug interaction that may occur.  Tsering Leaman DO  07/15/2015 8:30 PM

## 2015-07-16 ENCOUNTER — Telehealth: Payer: Self-pay | Admitting: Internal Medicine

## 2015-07-16 LAB — CBC WITH DIFFERENTIAL/PLATELET
BASOS ABS: 0 10*3/uL (ref 0.0–0.1)
Basophils Relative: 0.2 % (ref 0.0–3.0)
EOS PCT: 0.1 % (ref 0.0–5.0)
Eosinophils Absolute: 0 10*3/uL (ref 0.0–0.7)
HEMATOCRIT: 39.4 % (ref 36.0–46.0)
HEMOGLOBIN: 13 g/dL (ref 12.0–15.0)
LYMPHS PCT: 16.7 % (ref 12.0–46.0)
Lymphs Abs: 3.1 10*3/uL (ref 0.7–4.0)
MCHC: 33 g/dL (ref 30.0–36.0)
MCV: 82.7 fl (ref 78.0–100.0)
MONOS PCT: 5 % (ref 3.0–12.0)
Monocytes Absolute: 0.9 10*3/uL (ref 0.1–1.0)
Neutro Abs: 14.3 10*3/uL — ABNORMAL HIGH (ref 1.4–7.7)
Neutrophils Relative %: 78 % — ABNORMAL HIGH (ref 43.0–77.0)
Platelets: 366 10*3/uL (ref 150.0–400.0)
RBC: 4.76 Mil/uL (ref 3.87–5.11)
RDW: 14.5 % (ref 11.5–15.5)
WBC: 18.4 10*3/uL (ref 4.0–10.5)

## 2015-07-16 LAB — SEDIMENTATION RATE: Sed Rate: 17 mm/hr (ref 0–22)

## 2015-07-16 NOTE — Telephone Encounter (Signed)
Pt having allergic reactions off and on for past few weeks.  Has had hives, facial swelling, lip swelling.  Treated at urgent care yesterday with pred taper.  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  Katie please advise

## 2015-07-16 NOTE — Telephone Encounter (Signed)
Called and spoke to pt. Advised pt that unfortunately cannot work her in any sooner. Gave pt the name, number, and address to Dr. Bruna Potter office. Pt verbalized understanding and denied any further questions or concerns at this time.

## 2015-07-16 NOTE — Telephone Encounter (Signed)
Sorry we do not have any ASAP openings; please refer patient to Dr Bruna Potter group. Thanks.

## 2015-07-16 NOTE — Telephone Encounter (Signed)
Emily Craig- ok to work her in sooner if we can. Otherwise she may want to see one of the other allergy people in town.

## 2015-07-25 ENCOUNTER — Encounter (HOSPITAL_COMMUNITY): Payer: Self-pay | Admitting: Emergency Medicine

## 2015-07-25 ENCOUNTER — Emergency Department (HOSPITAL_COMMUNITY)
Admission: EM | Admit: 2015-07-25 | Discharge: 2015-07-25 | Disposition: A | Discharge status: Home / Self Care | Payer: 59 | Attending: Emergency Medicine | Admitting: Emergency Medicine

## 2015-07-25 DIAGNOSIS — I10 Essential (primary) hypertension: Secondary | ICD-10-CM | POA: Diagnosis not present

## 2015-07-25 DIAGNOSIS — Z79899 Other long term (current) drug therapy: Secondary | ICD-10-CM | POA: Insufficient documentation

## 2015-07-25 DIAGNOSIS — R220 Localized swelling, mass and lump, head: Secondary | ICD-10-CM | POA: Diagnosis present

## 2015-07-25 DIAGNOSIS — E079 Disorder of thyroid, unspecified: Secondary | ICD-10-CM | POA: Insufficient documentation

## 2015-07-25 DIAGNOSIS — T7840XA Allergy, unspecified, initial encounter: Secondary | ICD-10-CM | POA: Insufficient documentation

## 2015-07-25 MED ORDER — PREDNISONE 20 MG PO TABS: ORAL_TABLET | ORAL | Status: DC

## 2015-07-25 MED ORDER — PREDNISONE 20 MG PO TABS
60.0000 mg | ORAL_TABLET | Freq: Once | ORAL | Status: AC
  Administered 2015-07-25: 60 mg via ORAL
  Filled 2015-07-25: qty 3

## 2015-07-25 MED ORDER — FAMOTIDINE 20 MG PO TABS
20.0000 mg | ORAL_TABLET | Freq: Once | ORAL | Status: AC
  Administered 2015-07-25: 20 mg via ORAL
  Filled 2015-07-25: qty 1

## 2015-07-25 NOTE — ED Provider Notes (Signed)
CSN: [id number]     Arrival date & time 07/25/15  0914 History   First MD Initiated Contact with Patient 07/25/15 [phone]     Chief Complaint  Patient presents with  . Facial Swelling     (Consider location/radiation/quality/duration/timing/severity/associated sxs/prior Treatment) Patient is a 44 y.o. female presenting with allergic reaction. The history is provided by the patient.  Allergic Reaction Presenting symptoms: no wheezing   Severity:  Moderate Prior allergic episodes:  No prior episodes Relieved by:  Steroids Worsened by:  Nothing tried Ineffective treatments:  None tried  44 yo F with a chief complaint facial swelling. This is been off and on for about a month. Patient has been given steroid treatments with relief and then when she stops the steroids they recur. Patient recently reduced her dose of steroids and had a recurrence. Felt like her throat was closing up, so she decided to come to the emergency department. Denies any shortness of breath vomiting diarrhea denies any lightheadedness feeling like she'll pass out.  Past Medical History  Diagnosis Date  . Thyroid disease   . Hypertension    Past Surgical History  Procedure Laterality Date  . Cholecystectomy     Family History  Problem Relation Age of Onset  . Hypertension Mother   . Aortic aneurysm Mother   . Cancer Mother     pancreatic cancer  . Heart disease Father   . Hyperlipidemia Brother   . Hypertension Brother   . Diabetes Maternal Grandmother   . Heart disease Maternal Grandmother   . Cancer Maternal Grandfather   . Stroke Paternal Grandmother    Social History  Substance Use Topics  . Smoking status: Never Smoker   . Smokeless tobacco: Never Used  . Alcohol Use: No   OB History    No data available     Review of Systems  Constitutional: Negative for fever and chills.  HENT: Negative for congestion and rhinorrhea.   Eyes: Negative for redness and visual disturbance.  Respiratory:  Negative for shortness of breath and wheezing.   Cardiovascular: Negative for chest pain and palpitations.  Gastrointestinal: Negative for nausea and vomiting.  Genitourinary: Negative for dysuria and urgency.  Musculoskeletal: Negative for myalgias and arthralgias.  Skin: Negative for pallor and wound.  Neurological: Negative for dizziness and headaches.      Allergies  Penicillins and Sulfa antibiotics  Home Medications   Prior to Admission medications   Medication Sig Start Date End Date Taking? Authorizing Freemon Binford  levothyroxine (SYNTHROID, LEVOTHROID) 200 MCG tablet Take 200 mcg by mouth daily before breakfast.   Yes Historical Benjerman Molinelli, MD  ranitidine (ZANTAC) 150 MG tablet Take 150 mg by mouth daily.   Yes Historical Fannie Gathright, MD  EPINEPHrine 0.3 mg/0.3 mL IJ SOAJ injection Inject 0.3 mLs (0.3 mg total) into the muscle once. 07/05/15   Chelle Jeffery, PA-C  predniSONE (DELTASONE) 20 MG tablet 2 tabs po daily x 4 days 07/25/15   Deno Etienne, DO   BP 142/76 mmHg  Pulse 65  Temp(Src) 98 F (36.7 C) (Oral)  Resp 19  Ht 5\' 6"  (1.676 m)  Wt 308 lb (139.708 kg)  BMI 49.74 kg/m2  SpO2 97%  LMP 07/05/2015 Physical Exam  Constitutional: She is oriented to person, place, and time. She appears well-developed and well-nourished. No distress.  HENT:  Head: Normocephalic and atraumatic.  Mild facial swelling up to the ears. Hives noted about the face. No noted intraoral swelling able to tolerate secretions without difficulty  Eyes: EOM are normal. Pupils are equal, round, and reactive to light.  Neck: Normal range of motion. Neck supple.  Cardiovascular: Normal rate and regular rhythm.  Exam reveals no gallop and no friction rub.   No murmur heard. Pulmonary/Chest: Effort normal. She has no wheezes. She has no rales.  Abdominal: Soft. She exhibits no distension. There is no tenderness. There is no rebound.  Musculoskeletal: She exhibits no edema or tenderness.  Neurological: She is  alert and oriented to person, place, and time.  Skin: Skin is warm and dry. She is not diaphoretic.  No noted hives other than on the face  Psychiatric: She has a normal mood and affect. Her behavior is normal.    ED Course  Procedures (including critical care time) Labs Review Labs Reviewed - No data to display  Imaging Review No results found. I have personally reviewed and evaluated these images and lab results as part of my medical decision-making.   EKG Interpretation None      MDM   Final diagnoses:  Allergic reaction, initial encounter    44 yo F with a chief complaint of allergic reaction. Only one system involved appears to be skin. No intraoral swelling. Patient treated with steroids and H2 blockers. These relieved her symptoms. Discussed with patient that it appears she is probably related to something that she is putting on her face since they're localized to that area. Recommend that she change her shampoo moisturizer face wash makeup. Patient has a appointment with an allergist on Tuesday. We'll start her back on burst of steroids.  11:40 AM:  I have discussed the diagnosis/risks/treatment options with the patient and family and believe the pt to be eligible for discharge home to follow-up with PCP. We also discussed returning to the ED immediately if new or worsening sx occur. We discussed the sx which are most concerning (e.g., sob, anaphylaxis) that necessitate immediate return. Medications administered to the patient during their visit and any new prescriptions provided to the patient are listed below.  Medications given during this visit Medications  predniSONE (DELTASONE) tablet 60 mg (60 mg Oral Given 07/25/15 0948)  famotidine (PEPCID) tablet 20 mg (20 mg Oral Given 07/25/15 0948)    Discharge Medication List as of 07/25/2015 11:29 AM       The patient appears reasonably screen and/or stabilized for discharge and I doubt any other medical condition or other  [facility] requiring further screening, evaluation, or treatment in the ED at this time prior to discharge.      Deno Etienne, DO 07/25/15 1140

## 2015-07-25 NOTE — ED Notes (Signed)
Pt has facial swelling and hives along with left thumb swelling that started a month ago. Pt has been started on steroids and they have helped some. But symptoms have come back. Pt has allergy appointment this coming week. Pt airway intact

## 2015-07-25 NOTE — Discharge Instructions (Signed)

## 2015-08-05 ENCOUNTER — Encounter: Payer: Self-pay | Admitting: Physician Assistant

## 2015-08-05 DIAGNOSIS — L309 Dermatitis, unspecified: Secondary | ICD-10-CM | POA: Insufficient documentation

## 2015-11-26 DIAGNOSIS — R21 Rash and other nonspecific skin eruption: Secondary | ICD-10-CM | POA: Diagnosis not present

## 2016-02-28 MED FILL — LEVOTHYROXINE 200 MCG TAB: 200 | 90 days supply | Qty: 90 | Fill #0

## 2016-05-29 DIAGNOSIS — H5201 Hypermetropia, right eye: Secondary | ICD-10-CM | POA: Diagnosis not present

## 2016-05-29 DIAGNOSIS — H52222 Regular astigmatism, left eye: Secondary | ICD-10-CM | POA: Diagnosis not present

## 2016-05-29 DIAGNOSIS — H5212 Myopia, left eye: Secondary | ICD-10-CM | POA: Diagnosis not present

## 2016-05-29 DIAGNOSIS — H524 Presbyopia: Secondary | ICD-10-CM | POA: Diagnosis not present

## 2016-06-21 MED FILL — LEVOTHYROXINE 200 MCG TAB: 200 | 90 days supply | Qty: 90 | Fill #1

## 2016-08-16 ENCOUNTER — Encounter: Payer: Self-pay | Admitting: Emergency Medicine

## 2016-08-18 ENCOUNTER — Ambulatory Visit (INDEPENDENT_AMBULATORY_CARE_PROVIDER_SITE_OTHER): Payer: 59 | Admitting: Family Medicine

## 2016-08-18 ENCOUNTER — Encounter: Payer: Self-pay | Admitting: Family Medicine

## 2016-08-18 VITALS — BP 124/76 | HR 82 | Temp 98.4°F | Resp 17 | Ht 66.0 in | Wt 305.2 lb

## 2016-08-18 DIAGNOSIS — Z1231 Encounter for screening mammogram for malignant neoplasm of breast: Secondary | ICD-10-CM | POA: Diagnosis not present

## 2016-08-18 DIAGNOSIS — L509 Urticaria, unspecified: Secondary | ICD-10-CM | POA: Insufficient documentation

## 2016-08-18 DIAGNOSIS — E039 Hypothyroidism, unspecified: Secondary | ICD-10-CM

## 2016-08-18 LAB — CBC WITH DIFFERENTIAL/PLATELET
BASOS ABS: 0.1 10*3/uL (ref 0.0–0.1)
Basophils Relative: 1 % (ref 0.0–3.0)
EOS PCT: 2.5 % (ref 0.0–5.0)
Eosinophils Absolute: 0.1 10*3/uL (ref 0.0–0.7)
HCT: 36.6 % (ref 36.0–46.0)
HEMOGLOBIN: 12.2 g/dL (ref 12.0–15.0)
Lymphocytes Relative: 42.6 % (ref 12.0–46.0)
Lymphs Abs: 2.5 10*3/uL (ref 0.7–4.0)
MCHC: 33.3 g/dL (ref 30.0–36.0)
MCV: 80.2 fl (ref 78.0–100.0)
MONO ABS: 0.6 10*3/uL (ref 0.1–1.0)
Monocytes Relative: 9.9 % (ref 3.0–12.0)
NEUTROS PCT: 44 % (ref 43.0–77.0)
Neutro Abs: 2.6 10*3/uL (ref 1.4–7.7)
Platelets: 291 10*3/uL (ref 150.0–400.0)
RBC: 4.57 Mil/uL (ref 3.87–5.11)
RDW: 14.7 % (ref 11.5–15.5)
WBC: 6 10*3/uL (ref 4.0–10.5)

## 2016-08-18 LAB — LIPID PANEL
CHOL/HDL RATIO: 4
Cholesterol: 199 mg/dL (ref 0–200)
HDL: 45.4 mg/dL (ref >39.00)
LDL CALC: 134 mg/dL — AB (ref 0–99)
NonHDL: 153.33
TRIGLYCERIDES: 96 mg/dL (ref 0.0–149.0)
VLDL: 19.2 mg/dL (ref 0.0–40.0)

## 2016-08-18 LAB — HEPATIC FUNCTION PANEL
ALBUMIN: 4.2 g/dL (ref 3.5–5.2)
ALT: 13 U/L (ref 0–35)
AST: 12 U/L (ref 0–37)
Alkaline Phosphatase: 53 U/L (ref 39–117)
Bilirubin, Direct: 0.1 mg/dL (ref 0.0–0.3)
TOTAL PROTEIN: 6.6 g/dL (ref 6.0–8.3)
Total Bilirubin: 0.4 mg/dL (ref 0.2–1.2)

## 2016-08-18 LAB — BASIC METABOLIC PANEL
BUN: 9 mg/dL (ref 6–23)
CHLORIDE: 104 meq/L (ref 96–112)
CO2: 26 mEq/L (ref 19–32)
CREATININE: 0.81 mg/dL (ref 0.40–1.20)
Calcium: 9.2 mg/dL (ref 8.4–10.5)
GFR: 80.35 mL/min (ref >60.00)
GLUCOSE: 86 mg/dL (ref 70–99)
Potassium: 4.2 mEq/L (ref 3.5–5.1)
Sodium: 139 mEq/L (ref 135–145)

## 2016-08-18 LAB — TSH: TSH: 4.14 u[IU]/mL (ref 0.35–4.50)

## 2016-08-18 NOTE — Progress Notes (Signed)
   Subjective:    Patient ID: Emily Craig, female    DOB: February 17, 1971, 45 y.o.   MRN: [id number]  HPI New to establish.  No previous PCP.  GYN- [facility]  Hypothyroid- chornic problem, on Levothroxine 233mcg daily.  Labs last checked ~1 yr ago.  Pt denies current sxs- no fatigue, changes to skin/hair/nails, no cold intolerance, depression.  Obesity- pt's BMI is 49.  Not following a particular diet.  Pt has recently started walking 20-40 minutes up to 4 days/week.  Due for labs to risk stratify.  Pt has done Weight Watchers previously w/ good success.  Hives- pt has unknown allergic trigger that causes hives/swelling.  Controls w/ Zyrtec and Ranitidine and has Epi pen to use as needed.  Health Maintenance- due for mammo, last done 2 yrs ago.   Review of Systems For ROS see HPI     Objective:   Physical Exam  Constitutional: She is oriented to person, place, and time. She appears well-developed and well-nourished. No distress.  obese  HENT:  Head: Normocephalic and atraumatic.  Eyes: Conjunctivae and EOM are normal. Pupils are equal, round, and reactive to light.  Neck: Normal range of motion. Neck supple. No thyromegaly present.  Cardiovascular: Normal rate, regular rhythm, normal heart sounds and intact distal pulses.   No murmur heard. Pulmonary/Chest: Effort normal and breath sounds normal. No respiratory distress.  Abdominal: Soft. She exhibits no distension. There is no tenderness.  Musculoskeletal: She exhibits no edema.  Lymphadenopathy:    She has no cervical adenopathy.  Neurological: She is alert and oriented to person, place, and time.  Skin: Skin is warm and dry.  Psychiatric: She has a normal mood and affect. Her behavior is normal.  Vitals reviewed.         Assessment & Plan:

## 2016-08-18 NOTE — Assessment & Plan Note (Signed)
New to provider, ongoing for pt.  She previously lost 55 lbs doing Weight Watchers and is thinking about joining again.  Stressed need for healthy, low carb diet and regular exercise.  Will follow closely.

## 2016-08-18 NOTE — Patient Instructions (Signed)
Schedule your complete physical in 6 months We'll notify you of your lab results and make any changes if needed Continue to work on healthy diet and regular exercise- you can do it!!! We'll call you with your mammogram appt Call with any questions or concerns Welcome!  We're glad to have you!!!

## 2016-08-18 NOTE — Progress Notes (Signed)
Pre visit review using our clinic review tool, if applicable. No additional management support is needed unless otherwise documented below in the visit note. 

## 2016-08-18 NOTE — Assessment & Plan Note (Signed)
New to provider, ongoing for pt.  Currently asymptomatic.  Check labs.  Adjust meds prn  

## 2016-08-18 NOTE — Assessment & Plan Note (Signed)
New to provider, ongoing for pt.  She has had workup w/ Dr Fredderick Phenix and is currently controlling her sxs w/ Zantac and Zyrtec.  She has an Epipen if needed.  Will follow along and assist as able.

## 2016-09-14 ENCOUNTER — Ambulatory Visit
Admission: RE | Admit: 2016-09-14 | Discharge: 2016-09-14 | Disposition: A | Discharge status: Home / Self Care | Payer: 59 | Source: Ambulatory Visit | Attending: Family Medicine | Admitting: Family Medicine

## 2016-09-14 DIAGNOSIS — Z1231 Encounter for screening mammogram for malignant neoplasm of breast: Secondary | ICD-10-CM | POA: Diagnosis not present

## 2016-09-21 ENCOUNTER — Encounter: Payer: Self-pay | Admitting: Family Medicine

## 2016-11-07 ENCOUNTER — Ambulatory Visit: Payer: 59 | Admitting: Family Medicine

## 2016-11-15 DIAGNOSIS — D171 Benign lipomatous neoplasm of skin and subcutaneous tissue of trunk: Secondary | ICD-10-CM | POA: Diagnosis not present

## 2016-11-15 DIAGNOSIS — D1801 Hemangioma of skin and subcutaneous tissue: Secondary | ICD-10-CM | POA: Diagnosis not present

## 2016-11-15 DIAGNOSIS — D485 Neoplasm of uncertain behavior of skin: Secondary | ICD-10-CM | POA: Diagnosis not present

## 2016-11-15 DIAGNOSIS — D179 Benign lipomatous neoplasm, unspecified: Secondary | ICD-10-CM | POA: Diagnosis not present

## 2016-11-15 DIAGNOSIS — D225 Melanocytic nevi of trunk: Secondary | ICD-10-CM | POA: Diagnosis not present

## 2016-11-15 DIAGNOSIS — L918 Other hypertrophic disorders of the skin: Secondary | ICD-10-CM | POA: Diagnosis not present

## 2016-11-15 DIAGNOSIS — D2239 Melanocytic nevi of other parts of face: Secondary | ICD-10-CM | POA: Diagnosis not present

## 2016-11-15 DIAGNOSIS — L218 Other seborrheic dermatitis: Secondary | ICD-10-CM | POA: Diagnosis not present

## 2016-11-15 DIAGNOSIS — L814 Other melanin hyperpigmentation: Secondary | ICD-10-CM | POA: Diagnosis not present

## 2016-11-15 DIAGNOSIS — B351 Tinea unguium: Secondary | ICD-10-CM | POA: Diagnosis not present

## 2016-11-15 DIAGNOSIS — L738 Other specified follicular disorders: Secondary | ICD-10-CM | POA: Diagnosis not present

## 2016-11-30 ENCOUNTER — Other Ambulatory Visit: Payer: 59 | Admitting: *Deleted

## 2016-11-30 ENCOUNTER — Other Ambulatory Visit: Payer: Self-pay

## 2016-11-30 DIAGNOSIS — R319 Hematuria, unspecified: Secondary | ICD-10-CM

## 2016-11-30 DIAGNOSIS — B351 Tinea unguium: Secondary | ICD-10-CM | POA: Diagnosis not present

## 2016-11-30 DIAGNOSIS — M545 Low back pain: Secondary | ICD-10-CM

## 2016-12-01 LAB — URINALYSIS
Bilirubin, UA: NEGATIVE
Glucose, UA: NEGATIVE
KETONES UA: NEGATIVE
NITRITE UA: NEGATIVE
PH UA: 5 (ref 5.0–7.5)
Protein, UA: NEGATIVE
Specific Gravity, UA: 1.009 (ref 1.005–1.030)
UUROB: 0.2 mg/dL (ref 0.2–1.0)

## 2016-12-01 LAB — CBC
HEMATOCRIT: 36.7 % (ref 34.0–46.6)
HEMOGLOBIN: 12.5 g/dL (ref 11.1–15.9)
MCH: 26.6 pg (ref 26.6–33.0)
MCHC: 34.1 g/dL (ref 31.5–35.7)
MCV: 78 fL — ABNORMAL LOW (ref 79–97)
Platelets: 316 10*3/uL (ref 150–379)
RBC: 4.7 x10E6/uL (ref 3.77–5.28)
RDW: 14.8 % (ref 12.3–15.4)
WBC: 8.8 10*3/uL (ref 3.4–10.8)

## 2016-12-01 LAB — BASIC METABOLIC PANEL
BUN/Creatinine Ratio: 14 (ref 9–23)
BUN: 11 mg/dL (ref 6–24)
CO2: 23 mmol/L (ref 18–29)
CREATININE: 0.81 mg/dL (ref 0.57–1.00)
Calcium: 9.3 mg/dL (ref 8.7–10.2)
Chloride: 98 mmol/L (ref 96–106)
GFR calc Af Amer: 100 mL/min/{1.73_m2} (ref >59)
GFR, EST NON AFRICAN AMERICAN: 87 mL/min/{1.73_m2} (ref >59)
GLUCOSE: 87 mg/dL (ref 65–99)
Potassium: 4.2 mmol/L (ref 3.5–5.2)
Sodium: 138 mmol/L (ref 134–144)

## 2016-12-06 ENCOUNTER — Encounter: Payer: Self-pay | Admitting: Family Medicine

## 2016-12-12 DIAGNOSIS — Z6841 Body Mass Index (BMI) 40.0 and over, adult: Secondary | ICD-10-CM | POA: Diagnosis not present

## 2016-12-12 DIAGNOSIS — Z124 Encounter for screening for malignant neoplasm of cervix: Secondary | ICD-10-CM | POA: Diagnosis not present

## 2016-12-12 DIAGNOSIS — Z01419 Encounter for gynecological examination (general) (routine) without abnormal findings: Secondary | ICD-10-CM | POA: Diagnosis not present

## 2016-12-12 LAB — HM PAP SMEAR

## 2016-12-13 ENCOUNTER — Encounter: Payer: Self-pay | Admitting: General Practice

## 2016-12-23 NOTE — Progress Notes (Signed)
[facility] [place], [name] [address] Phone: [phone] Subjective:    I'm seeing this patient by the request  of:  [name], MD   CC: patient states that she has had about 1 month of pain on the lateral aspect of her right hip.  [id number]  Emily Craig is a 46 y.o. female coming in with complaint of right hip pain. Lateral aspect. Greater than one month. Seems to go to the middle of her buttocks as well. Seems to be worse with going up or downstairs. Sometimes even walking can be painful.Seems to be mostly now radiating also known leg. Seems to give her arms a tingling sensation away to her foot. Patient states that she is noticing she seems to fatigue a little bit more and has to sit on a regular basis. Getting out of a chair from seated position is becoming more difficult as well. Sleeping last secondary to pain.Rates the severity pain is 8 out of 10 and potentially worsening.     Past Medical History:  Diagnosis Date  . Hypertension   . Thyroid disease    Past Surgical History:  Procedure Laterality Date  . CHOLECYSTECTOMY    . TONSILLECTOMY AND ADENOIDECTOMY     Social History   Social History  . Marital status: Married    Spouse name: [name]  . Number of children: 3  . Years of education: RN   Occupational History  . RN [employer]   Social History Main Topics  . Smoking status: Never Smoker  . Smokeless tobacco: Never Used  . Alcohol use 0.0 oz/week  . Drug use: No  . Sexual activity: Not Asked   Other Topics Concern  . None   Social History Narrative   Lives with her husband and their daughter.  Their sons (twins) stayed in [place] when they moved here June 2014.   Allergies  Allergen Reactions  . Penicillins     Allergy since childhood/unknown reaction  . Sulfa Antibiotics Rash   Family History  Problem Relation Age of Onset  . Hypertension Mother   . Aortic aneurysm  Mother   . Cancer Mother     pancreatic cancer  . Heart disease Father   . Hyperlipidemia Brother   . Hypertension Brother   . Diabetes Maternal Grandmother   . Heart disease Maternal Grandmother   . Cancer Maternal Grandmother     reproductive   . Liver disease Maternal Grandfather   . Stroke Paternal Grandmother     Past medical history, social, surgical and family history all reviewed in electronic medical record.  No pertanent information unless stated regarding to the chief complaint.   Review of Systems: No headache, visual changes, nausea, vomiting, diarrhea, constipation, dizziness, abdominal pain, skin rash, fevers, chills, night sweats, weight loss, swollen lymph nodes, body aches, joint swelling, muscle aches, chest pain, shortness of breath, mood changes.     Objective  Blood pressure 136/88, pulse 70, height 5\' 6"  (1.676 m), weight (!) 302 lb (137 kg), SpO2 98 %.   Systems examined below as of 12/25/16 General: NAD A&O x3 mood, affect normal  HEENT: Pupils equal, extraocular movements intact no nystagmus Respiratory: not short of breath at rest or with speaking Cardiovascular: No lower extremity edema, non tender Skin: Warm dry intact with no signs of infection or rash on extremities or on axial skeleton. Abdomen: Soft nontender, no masses Neuro: Cranial nerves  intact,  neurovascularly intact in all extremities with 2+ DTRs and 2+ pulses. Lymph: No lymphadenopathy appreciated today  Gait normal with good balance and coordination.  MSK: Non tender with full range of motion and good stability and symmetric strength and tone of shoulders, elbows, wrist,  knee hips and ankles bilaterally.    [id number]  ROM IR: 25 Deg, ER: 45 Deg, Flexion: 120 Deg, Extension: 100 Deg, Abduction: 45 Deg, Adduction: 25 Deg Strength IR: 5/5, ER: 5/5, Flexion: 5/5, Extension: 5/5, Abduction: 5/5, Adduction: 5/5 More pain with Corky Sox test. Negative straight leg test but does have tightness.  Patient has some mild tenderness over the greater trochanteric area. Significant pain over the piriformis muscle. Mild pain over the L5-S1 area of the paraspinal musculature..      Impression and Recommendations:     This case required medical decision making of moderate complexity.      Note: This dictation was prepared with Dragon dictation along with smaller phrase technology. Any transcriptional errors that result from this process are unintentional.

## 2016-12-25 ENCOUNTER — Ambulatory Visit (INDEPENDENT_AMBULATORY_CARE_PROVIDER_SITE_OTHER): Payer: 59 | Admitting: Family Medicine

## 2016-12-25 ENCOUNTER — Encounter: Payer: Self-pay | Admitting: Family Medicine

## 2016-12-25 ENCOUNTER — Ambulatory Visit (INDEPENDENT_AMBULATORY_CARE_PROVIDER_SITE_OTHER)
Admission: RE | Admit: 2016-12-25 | Discharge: 2016-12-25 | Disposition: A | Discharge status: Home / Self Care | Payer: 59 | Source: Ambulatory Visit | Attending: Family Medicine | Admitting: Family Medicine

## 2016-12-25 VITALS — BP 136/88 | HR 70 | Ht 66.0 in | Wt 302.0 lb

## 2016-12-25 DIAGNOSIS — M5441 Lumbago with sciatica, right side: Secondary | ICD-10-CM

## 2016-12-25 DIAGNOSIS — M5410 Radiculopathy, site unspecified: Secondary | ICD-10-CM | POA: Diagnosis not present

## 2016-12-25 DIAGNOSIS — M545 Low back pain: Secondary | ICD-10-CM | POA: Diagnosis not present

## 2016-12-25 IMAGING — DX DG LUMBAR SPINE COMPLETE 4+V
5 series · 5 of 5 positions shown · non-contrast
Comparison: No recent prior .

CLINICAL DATA: Low back pain.  Sciatica .

EXAM:
LUMBAR SPINE - COMPLETE 4+ VIEW

[l-spine ap]
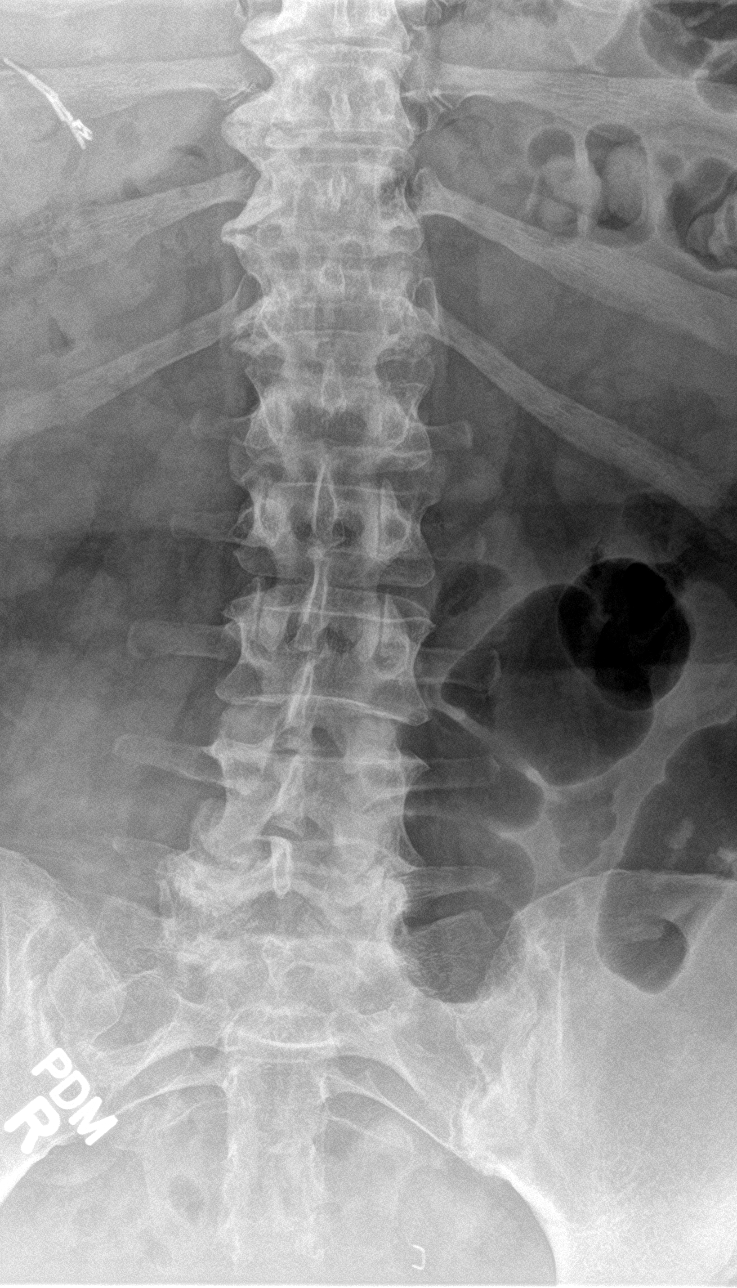

[l-spine obl (1 of 2)]
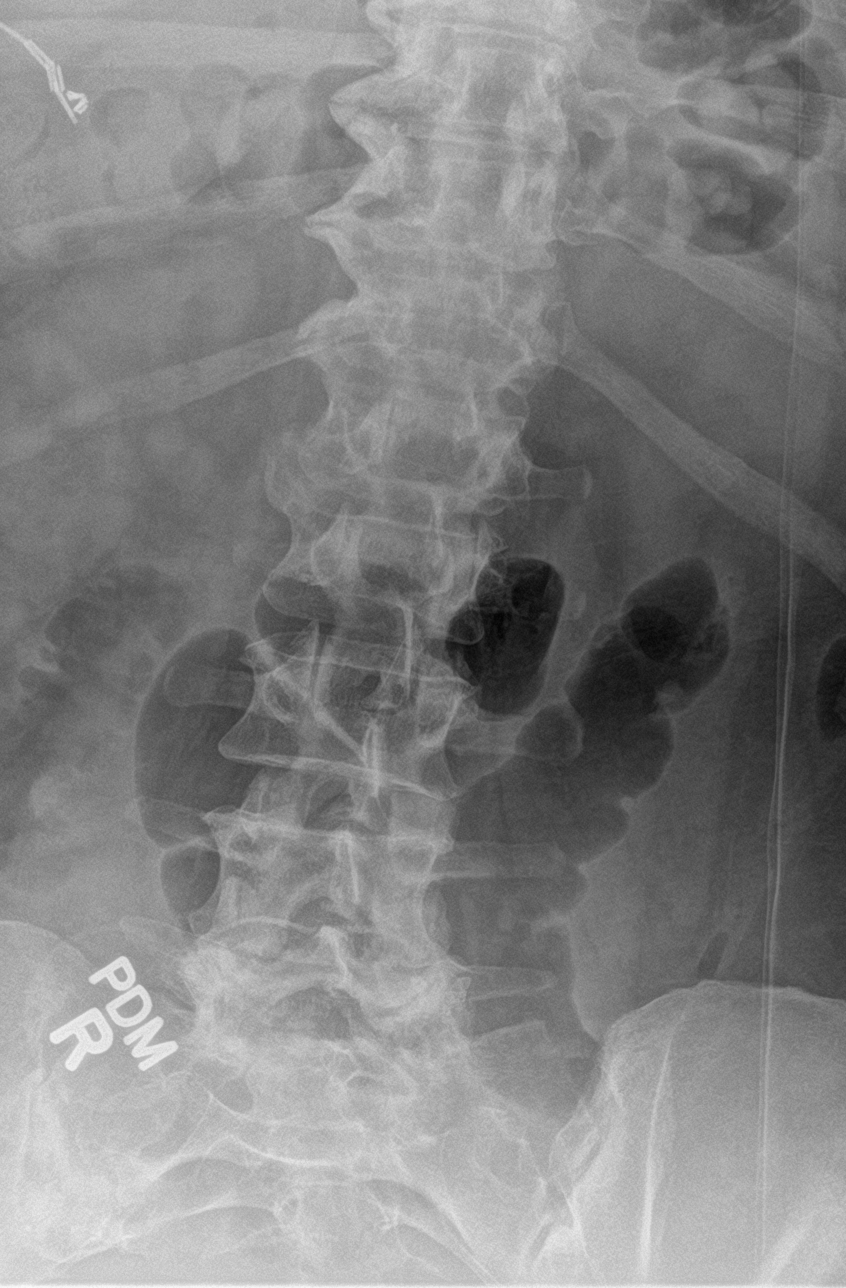

[l-spine obl (2 of 2)]
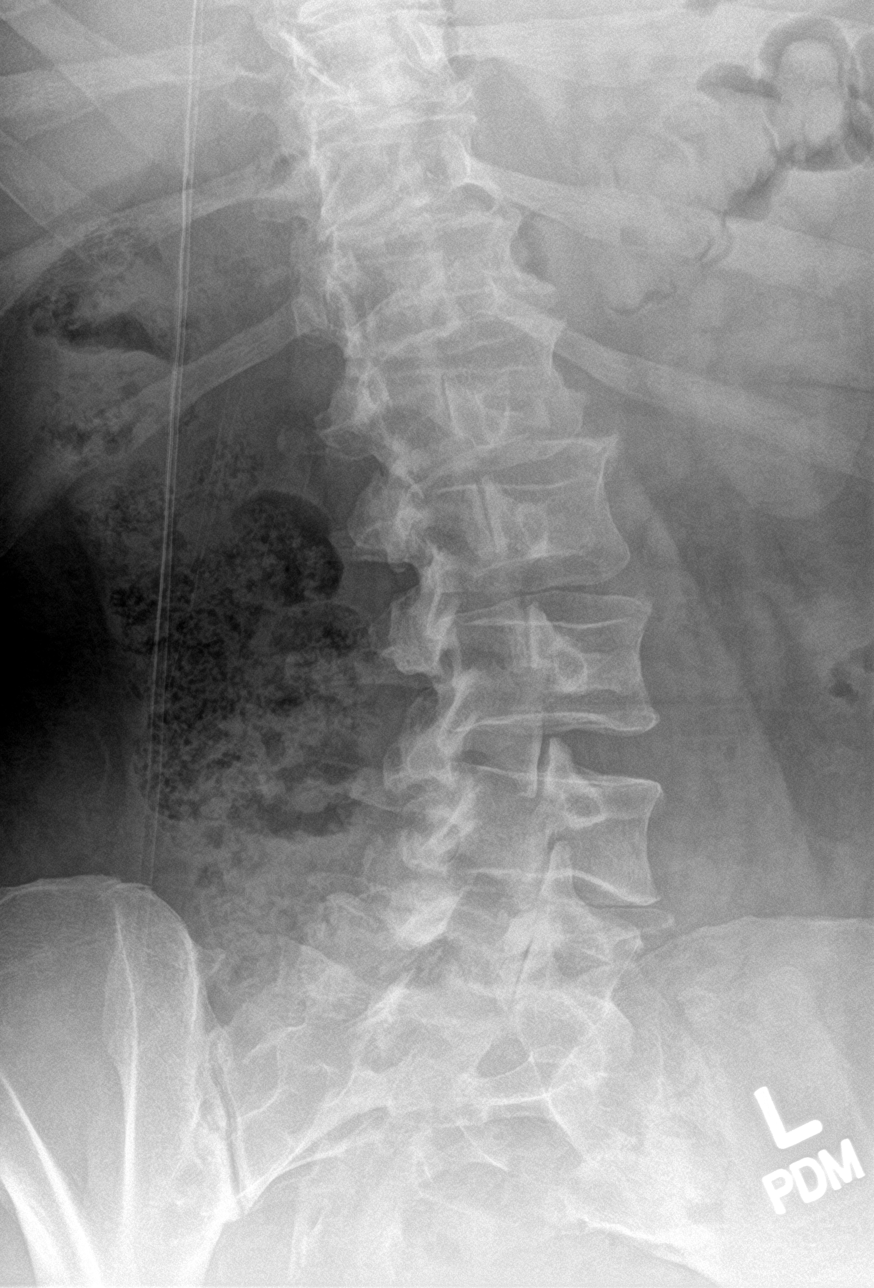

[l-spine lat]
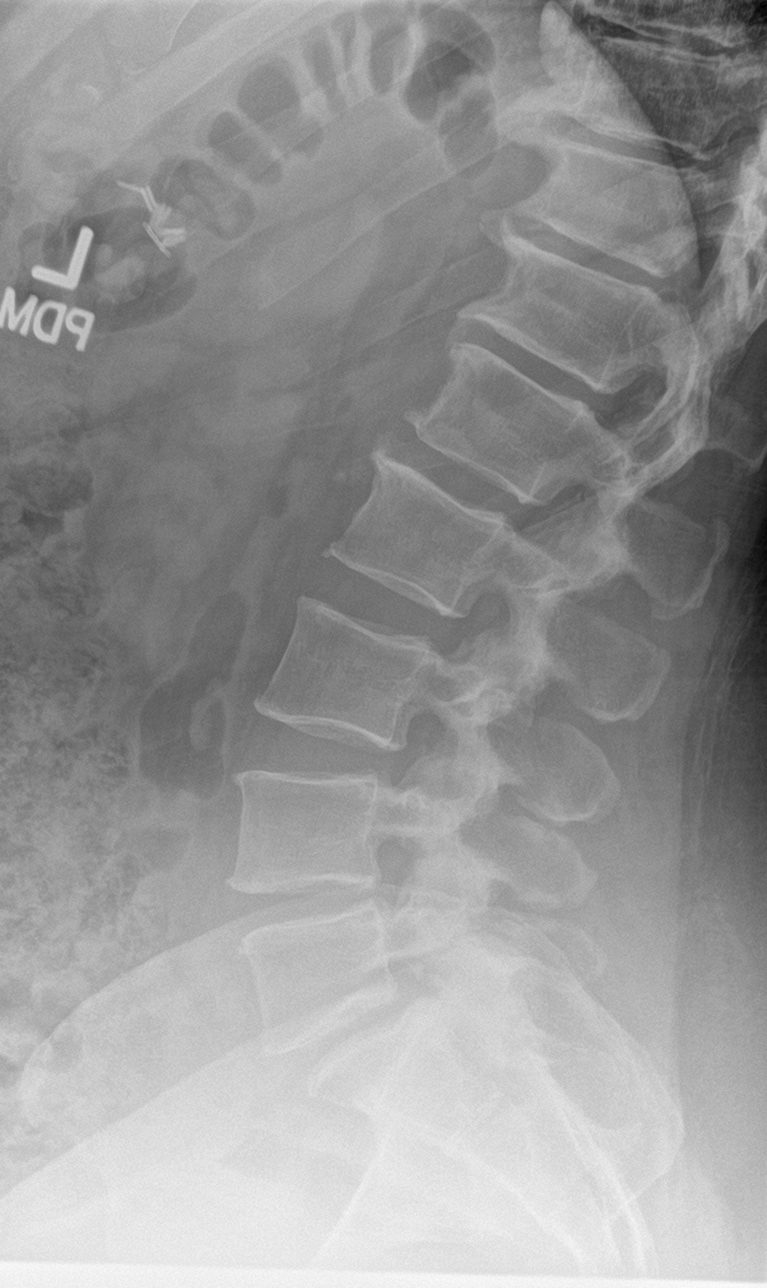

[l-spine spot]
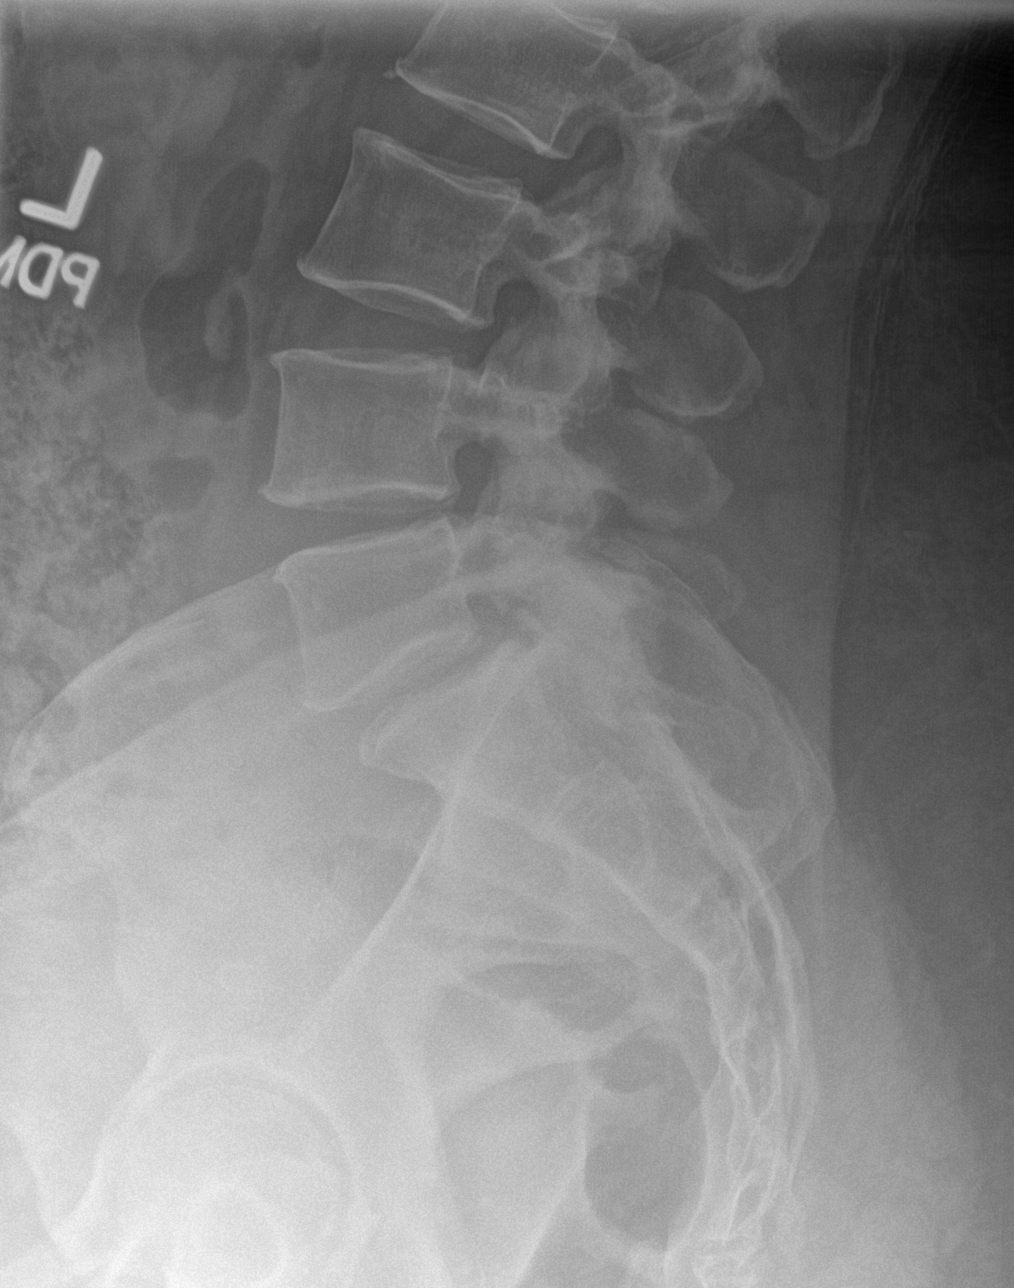

[5 of 5 positions shown; findings below may reference images not displayed]

FINDINGS: Diffuse multilevel degenerative change. 7 mm anterolisthesis L5-S1.
No evidence of fracture. Surgical clips right upper quadrant.
IMPRESSION: Diffuse multilevel degenerative change. 7 mm anterolisthesis L5-S1.
No acute bony abnormality.

## 2016-12-25 MED ORDER — GABAPENTIN 100 MG PO CAPS: 200.0000 mg | ORAL_CAPSULE | Freq: Every day | ORAL | 3 refills | Status: DC

## 2016-12-25 MED ORDER — PREDNISONE 50 MG PO TABS: 50.0000 mg | ORAL_TABLET | Freq: Every day | ORAL | 0 refills | Status: DC

## 2016-12-25 MED FILL — GABAPENTIN 100 MG CAPSULE: 100 | 30 days supply | Qty: 60 | Fill #0

## 2016-12-25 MED FILL — predniSONE 50 MG TABS: 50 | 5 days supply | Qty: 5 | Fill #0

## 2016-12-25 NOTE — Assessment & Plan Note (Signed)
Patient is had this pain for approximate 6 weeks and seems to be worsening. Differential includes lumbar radiculopathy, piriformis syndrome, greater trochanteric bursitis. Patient does have some weakness of the outside of the hip. Given home exercises and work with Product/process development scientist, we'll do a short course of prednisone as well as gabapentin. Pictures pending. Depending on results we will see patient back again in 10-14 days and increase activity as tolerated first possible physical therapy or advance imaging.

## 2016-12-25 NOTE — Patient Instructions (Addendum)
Good to see you.  Ice 20 minutes 2 times daily. Usually after activity and before bed. Exercises 3 times a week.  Prednisone daily for 5 days Gabapentin 200mg  at night For breakthrough pain ok to take tylenol 500mg  3 times daily  You should get better fast, just depends on how fast and if it stay away.  See me again in 10-14 days.

## 2016-12-28 ENCOUNTER — Encounter: Payer: Self-pay | Admitting: Family Medicine

## 2016-12-28 NOTE — Telephone Encounter (Signed)
Please advise?   Pt last ov 08/18/16 Hypothyroid Levothyroxine historical  Last TSH 08/18/16 4.4

## 2016-12-29 MED ORDER — LEVOTHYROXINE SODIUM 200 MCG PO TABS: 200.0000 ug | ORAL_TABLET | Freq: Every day | ORAL | 1 refills | Status: DC

## 2016-12-29 MED FILL — LEVOTHYROXINE 200 MCG TAB: 200 | 90 days supply | Qty: 90 | Fill #0

## 2017-01-10 DIAGNOSIS — N926 Irregular menstruation, unspecified: Secondary | ICD-10-CM | POA: Diagnosis not present

## 2017-01-16 ENCOUNTER — Ambulatory Visit: Payer: 59 | Admitting: Family Medicine

## 2017-01-16 ENCOUNTER — Ambulatory Visit (INDEPENDENT_AMBULATORY_CARE_PROVIDER_SITE_OTHER): Payer: 59 | Admitting: Family Medicine

## 2017-01-16 ENCOUNTER — Encounter: Payer: Self-pay | Admitting: Family Medicine

## 2017-01-16 DIAGNOSIS — M5410 Radiculopathy, site unspecified: Secondary | ICD-10-CM

## 2017-01-16 DIAGNOSIS — M999 Biomechanical lesion, unspecified: Secondary | ICD-10-CM | POA: Insufficient documentation

## 2017-01-16 NOTE — Progress Notes (Signed)
[name] [place] [place], [place] [address] Phone: [phone] Subjective:    I'm seeing this patient by the request  of:  [name], MD   CC: Back pain follow-up  [id number]  [name] is a 46 y.o. female coming in with complaint of right hip pain. Patient was seen previously 2 weeks ago and was diagnosed with more of a lumbar radiculopathy. Does, prednisone, gabapentin. Patient states teaching pain is going down her legs is completely resolved at this time. Continues to have a dull throbbing aching pain and low back. Patient states that certain movements came constantly burning sensation on the posterior aspect the leg but overall seems to be doing somewhat better.Patient denies any weakness.   patient did have x-rays of the back that did show moderate to severe osteophytic changes.  Past Medical History:  Diagnosis Date  . Hypertension   . Thyroid disease    Past Surgical History:  Procedure Laterality Date  . CHOLECYSTECTOMY    . TONSILLECTOMY AND ADENOIDECTOMY     Social History   Social History  . Marital status: Married    Spouse name: Emily Craig  . Number of children: 3  . Years of education: RN   Occupational History  . RN [employer]   Social History Main Topics  . Smoking status: Never Smoker  . Smokeless tobacco: Never Used  . Alcohol use 0.0 oz/week  . Drug use: No  . Sexual activity: Not Asked   Other Topics Concern  . None   Social History Narrative   Lives with her husband and their daughter.  Their sons (twins) stayed in [place] when they moved here June 2014.   Allergies  Allergen Reactions  . Penicillins     Allergy since childhood/unknown reaction  . Sulfa Antibiotics Rash   Family History  Problem Relation Age of Onset  . Hypertension Mother   . Aortic aneurysm Mother   . Cancer Mother     pancreatic cancer  . Heart disease Father   . Hyperlipidemia Brother   .  Hypertension Brother   . Diabetes Maternal Grandmother   . Heart disease Maternal Grandmother   . Cancer Maternal Grandmother     reproductive   . Liver disease Maternal Grandfather   . Stroke Paternal Grandmother     Past medical history, social, surgical and family history all reviewed in electronic medical record.  No pertanent information unless stated regarding to the chief complaint.   Review of Systems: No headache, visual changes, nausea, vomiting, diarrhea, constipation, dizziness, abdominal pain, skin rash, fevers, chills, night sweats, weight loss, swollen lymph nodes, body aches, joint swelling, muscle aches, chest pain, shortness of breath, mood changes.     Objective  Blood pressure (!) 130/92, pulse 81, height 5\' 6"  (1.676 m), weight (!) 304 lb 4 oz (138 kg), SpO2 98 %.   Systems examined below as of 01/16/17 General: NAD A&O x3 mood, affect normal  HEENT: Pupils equal, extraocular movements intact no nystagmus Respiratory: not short of breath at rest or with speaking Cardiovascular: No lower extremity edema, non tender Skin: Warm dry intact with no signs of infection or rash on extremities or on axial skeleton. Abdomen: Soft nontender, no masses Neuro: Cranial nerves  intact, neurovascularly intact in all extremities with 2+ DTRs and 2+ pulses. Lymph: No lymphadenopathy appreciated today  Gait normal with good balance and coordination.  MSK: Non tender with full range of  motion and good stability and symmetric strength and tone of shoulders, elbows, wrist,  knee and ankles bilaterally.     BOF:BPZWC  ROM IR: 25 Deg, ER: 25 Deg, Flexion: 120 Deg, Extension: 100 Deg, Abduction: 45 Deg, Adduction: 25 Deg Strength IR: 5/5, ER: 5/5, Flexion: 5/5, Extension: 5/5, Abduction: 5/5, Adduction: 5/5  Back Exam:  Inspection: Unremarkable  Motion: Flexion 45 deg, Extension 25 deg, Side Bending to 45 deg bilaterally,  Rotation to 45 deg bilaterally  SLR laying: Negative  XSLR  laying: Negative  Palpable tenderness:mild tenderness still over the [place] been a musculature of L5-S1 right greater than left FABER positive on right. Sensory change: Gross sensation intact to all lumbar and sacral dermatomes.  Reflexes: 2+ at both patellar tendons, 2+ at achilles tendons, Babinski's downgoing.  Strength at foot  Plantar-flexion: 5/5 Dorsi-flexion: 5/5 Eversion: 5/5 Inversion: 5/5  Leg strength  Quad: 5/5 Hamstring: 5/5 Hip flexor: 5/5 Hip abductors: 5/5  Gait unremarkable.   Osteopathic findings Cervical C2 flexed rotated and side bent right C4 flexed rotated and side bent left C6 flexed rotated and side bent left T3 extended rotated and side bent right inhaled third rib T9 extended rotated and side bent left L2 flexed rotated and side bent right Sacrum right on right    Impression and Recommendations:     This case required medical decision making of moderate complexity.      Note: This dictation was prepared with Dragon dictation along with smaller phrase technology. Any transcriptional errors that result from this process are unintentional.

## 2017-01-16 NOTE — Patient Instructions (Addendum)
Good to see you  Ice will always be your friend. Continue the exercises  Stay active Gabapentin 100mg  at night and if  Better then stop it.  See em again in 3-4 weeks.

## 2017-01-16 NOTE — Assessment & Plan Note (Signed)
Decision today to treat with OMT was based on Physical Exam  After verbal consent patient was treated with HVLA, ME, FPR techniques in cervical, thoracic, lumbar and sacral areas  Patient tolerated the procedure well with improvement in symptoms  Patient given exercises, stretches and lifestyle modifications  See medications in patient instructions if given  Patient will follow up in 3-4 weeks  

## 2017-01-16 NOTE — Assessment & Plan Note (Signed)
Patient was doing better with conservative therapy. We did start patient to increase activity at this time. Patient given more aggressive therapy. Patient also responding fairly well to osteopathic manipulation. We discussed objective is to do a which ones to potentially avoid. Continue topical anti-inflammatories as needed. Patient was started decrease certain medications and given titration for this. Follow-up again in 3-4 weeks.

## 2017-02-07 DIAGNOSIS — N921 Excessive and frequent menstruation with irregular cycle: Secondary | ICD-10-CM | POA: Diagnosis not present

## 2017-02-07 DIAGNOSIS — N8500 Endometrial hyperplasia, unspecified: Secondary | ICD-10-CM | POA: Diagnosis not present

## 2017-02-14 ENCOUNTER — Encounter: Payer: Self-pay | Admitting: Family Medicine

## 2017-02-14 ENCOUNTER — Ambulatory Visit (INDEPENDENT_AMBULATORY_CARE_PROVIDER_SITE_OTHER): Payer: 59 | Admitting: Family Medicine

## 2017-02-14 VITALS — BP 123/83 | HR 81 | Temp 98.4°F | Resp 16 | Ht 66.0 in | Wt 297.5 lb

## 2017-02-14 DIAGNOSIS — Z0000 Encounter for general adult medical examination without abnormal findings: Secondary | ICD-10-CM

## 2017-02-14 LAB — CBC WITH DIFFERENTIAL/PLATELET
BASOS PCT: 1.1 % (ref 0.0–3.0)
Basophils Absolute: 0.1 10*3/uL (ref 0.0–0.1)
EOS PCT: 3.8 % (ref 0.0–5.0)
Eosinophils Absolute: 0.2 10*3/uL (ref 0.0–0.7)
HCT: 36.7 % (ref 36.0–46.0)
HEMOGLOBIN: 12.2 g/dL (ref 12.0–15.0)
Lymphocytes Relative: 35.9 % (ref 12.0–46.0)
Lymphs Abs: 2.3 10*3/uL (ref 0.7–4.0)
MCHC: 33.2 g/dL (ref 30.0–36.0)
MCV: 81.3 fl (ref 78.0–100.0)
MONO ABS: 0.6 10*3/uL (ref 0.1–1.0)
MONOS PCT: 9.3 % (ref 3.0–12.0)
Neutro Abs: 3.2 10*3/uL (ref 1.4–7.7)
Neutrophils Relative %: 49.9 % (ref 43.0–77.0)
Platelets: 292 10*3/uL (ref 150.0–400.0)
RBC: 4.52 Mil/uL (ref 3.87–5.11)
RDW: 14.6 % (ref 11.5–15.5)
WBC: 6.3 10*3/uL (ref 4.0–10.5)

## 2017-02-14 LAB — BASIC METABOLIC PANEL
BUN: 11 mg/dL (ref 6–23)
CALCIUM: 8.8 mg/dL (ref 8.4–10.5)
CO2: 25 mEq/L (ref 19–32)
CREATININE: 0.79 mg/dL (ref 0.40–1.20)
Chloride: 106 mEq/L (ref 96–112)
GFR: 82.53 mL/min (ref >60.00)
Glucose, Bld: 98 mg/dL (ref 70–99)
Potassium: 4.2 mEq/L (ref 3.5–5.1)
SODIUM: 139 meq/L (ref 135–145)

## 2017-02-14 LAB — LIPID PANEL
CHOL/HDL RATIO: 4
Cholesterol: 203 mg/dL — ABNORMAL HIGH (ref 0–200)
HDL: 46.2 mg/dL (ref >39.00)
LDL CALC: 142 mg/dL — AB (ref 0–99)
NONHDL: 156.91
Triglycerides: 76 mg/dL (ref 0.0–149.0)
VLDL: 15.2 mg/dL (ref 0.0–40.0)

## 2017-02-14 LAB — HEPATIC FUNCTION PANEL
ALT: 11 U/L (ref 0–35)
AST: 10 U/L (ref 0–37)
Albumin: 4 g/dL (ref 3.5–5.2)
Alkaline Phosphatase: 53 U/L (ref 39–117)
BILIRUBIN DIRECT: 0.1 mg/dL (ref 0.0–0.3)
BILIRUBIN TOTAL: 0.4 mg/dL (ref 0.2–1.2)
Total Protein: 6.6 g/dL (ref 6.0–8.3)

## 2017-02-14 LAB — VITAMIN D 25 HYDROXY (VIT D DEFICIENCY, FRACTURES): VITD: 24.29 ng/mL — ABNORMAL LOW (ref 30.00–100.00)

## 2017-02-14 LAB — TSH: TSH: 5.02 u[IU]/mL — AB (ref 0.35–4.50)

## 2017-02-14 NOTE — Progress Notes (Signed)
   Subjective:    Patient ID: Emily Craig, female    DOB: 10/29/1971, 46 y.o.   MRN: [id number]  HPI CPE- UTD on GYN.  UTD on Tdap, flu.  Pt is working on weight loss.   Review of Systems Patient reports no vision/ hearing changes, adenopathy,fever, weight change,  persistant/recurrent hoarseness , swallowing issues, chest pain, palpitations, edema, persistant/recurrent cough, hemoptysis, dyspnea (rest/exertional/paroxysmal nocturnal), gastrointestinal bleeding (melena, rectal bleeding), abdominal pain, significant heartburn, bowel changes, GU symptoms (dysuria, hematuria, incontinence), Gyn symptoms (abnormal  bleeding, pain),  syncope, focal weakness, memory loss, numbness & tingling, skin/hair/nail changes, abnormal bruising or bleeding, anxiety, or depression.     Objective:   Physical Exam General Appearance:    Alert, cooperative, no distress, appears stated age, obese  Head:    Normocephalic, without obvious abnormality, atraumatic  Eyes:    PERRL, conjunctiva/corneas clear, EOM's intact, fundi    benign, both eyes  Ears:    Normal TM's and external ear canals, both ears  Nose:   Nares normal, septum midline, mucosa normal, no drainage    or sinus tenderness  Throat:   Lips, mucosa, and tongue normal; teeth and gums normal  Neck:   Supple, symmetrical, trachea midline, no adenopathy;    Thyroid: no enlargement/tenderness/nodules  Back:     Symmetric, no curvature, ROM normal, no CVA tenderness  Lungs:     Clear to auscultation bilaterally, respirations unlabored  Chest Wall:    No tenderness or deformity   Heart:    Regular rate and rhythm, S1 and S2 normal, no murmur, rub   or gallop  Breast Exam:    Deferred to GYN  Abdomen:     Soft, non-tender, bowel sounds active all four quadrants,    no masses, no organomegaly  Genitalia:    Deferred to GYN  Rectal:    Extremities:   Extremities normal, atraumatic, no cyanosis or edema  Pulses:   2+ and symmetric all extremities    Skin:   Skin color, texture, turgor normal, no rashes or lesions  Lymph nodes:   Cervical, supraclavicular, and axillary nodes normal  Neurologic:   CNII-XII intact, normal strength, sensation and reflexes    throughout          Assessment & Plan:

## 2017-02-14 NOTE — Progress Notes (Signed)
Pre visit review using our clinic review tool, if applicable. No additional management support is needed unless otherwise documented below in the visit note. 

## 2017-02-14 NOTE — Patient Instructions (Signed)
Follow up in 6 months to check on weight loss progress We'll notify you of your lab results and make any changes if needed Continue to work on healthy diet and add regular exercise- you're doing great! Schedule your exercise- you are less likely to cancel if it's schedule Call with any questions or concerns Happy Spring!!!

## 2017-02-14 NOTE — Assessment & Plan Note (Signed)
Pt's PE WNL w/ exception of obesity.  UTD on GYN.  Check labs.  Anticipatory guidance provided.  

## 2017-02-14 NOTE — Assessment & Plan Note (Signed)
Pt has lost 6 lbs since last month.  Applauded her recent efforts.  Pt plans to add exercise to help w/ weight loss and stress management.  Will follow.

## 2017-02-19 ENCOUNTER — Other Ambulatory Visit: Payer: Self-pay | Admitting: General Practice

## 2017-02-19 DIAGNOSIS — E039 Hypothyroidism, unspecified: Secondary | ICD-10-CM

## 2017-02-19 MED ORDER — LEVOTHYROXINE SODIUM 25 MCG PO TABS: 25.0000 ug | ORAL_TABLET | Freq: Every day | ORAL | 3 refills | Status: DC

## 2017-02-19 MED ORDER — VITAMIN D (ERGOCALCIFEROL) 1.25 MG (50000 UNIT) PO CAPS: 50000.0000 [IU] | ORAL_CAPSULE | ORAL | 0 refills | Status: DC

## 2017-02-19 MED FILL — VIT D2 1.25 MG (50,000 UNIT: 1.25 MG | 84 days supply | Qty: 12 | Fill #0

## 2017-02-28 MED FILL — LEVOTHYROXINE 25 MCG TABLET: 25 | 30 days supply | Qty: 30 | Fill #0

## 2017-02-28 MED FILL — TRANEXAMIC ACID 650 MG TAB: 650 | 5 days supply | Qty: 30 | Fill #0

## 2017-03-23 ENCOUNTER — Other Ambulatory Visit (INDEPENDENT_AMBULATORY_CARE_PROVIDER_SITE_OTHER): Payer: 59

## 2017-03-23 DIAGNOSIS — E039 Hypothyroidism, unspecified: Secondary | ICD-10-CM | POA: Diagnosis not present

## 2017-03-23 LAB — TSH: TSH: 0.18 u[IU]/mL — AB (ref 0.35–4.50)

## 2017-03-26 ENCOUNTER — Encounter: Payer: Self-pay | Admitting: Family Medicine

## 2017-03-26 ENCOUNTER — Other Ambulatory Visit: Payer: Self-pay | Admitting: Family Medicine

## 2017-03-26 DIAGNOSIS — E039 Hypothyroidism, unspecified: Secondary | ICD-10-CM

## 2017-04-09 MED FILL — LEVOTHYROXINE 200 MCG TAB: 200 | 90 days supply | Qty: 90 | Fill #1

## 2017-05-02 ENCOUNTER — Other Ambulatory Visit (INDEPENDENT_AMBULATORY_CARE_PROVIDER_SITE_OTHER): Payer: 59

## 2017-05-02 DIAGNOSIS — E039 Hypothyroidism, unspecified: Secondary | ICD-10-CM | POA: Diagnosis not present

## 2017-05-02 LAB — TSH: TSH: 0.23 u[IU]/mL — ABNORMAL LOW (ref 0.35–4.50)

## 2017-05-03 ENCOUNTER — Other Ambulatory Visit: Payer: Self-pay | Admitting: General Practice

## 2017-05-03 ENCOUNTER — Telehealth: Payer: Self-pay | Admitting: Family Medicine

## 2017-05-03 DIAGNOSIS — E039 Hypothyroidism, unspecified: Secondary | ICD-10-CM

## 2017-05-03 MED ORDER — LEVOTHYROXINE SODIUM 175 MCG PO TABS: 175.0000 ug | ORAL_TABLET | Freq: Every day | ORAL | 3 refills | Status: DC

## 2017-05-03 MED FILL — LEVOTHYROXINE 175 MCG TABLE: 175 | 30 days supply | Qty: 30 | Fill #0

## 2017-05-03 MED FILL — GABAPENTIN 100 MG CAPSULE: 100 | 30 days supply | Qty: 60 | Fill #1

## 2017-05-03 NOTE — Telephone Encounter (Signed)
Sorry for the confusion!  Yes, she needs to decrease to 112mcg so please send a new script

## 2017-05-03 NOTE — Telephone Encounter (Signed)
Pt informed and med filled to pharmacy as requested.  

## 2017-05-03 NOTE — Telephone Encounter (Signed)
Patient state she has reviewed lab results on mychart and she needs clarification on the instructions from Dr. Birdie Riddle.  She states based on her TSH results she was instructed to decrease Synthroid to 242mcg.  However, patient states she has been taking 272mcg after her TSH was last checked on May 18.  Please clarify if pcp wants her to take 133mcg instead.  If so she will need a new rx sent to pharmacy for this dose.   Pharmacy: [place], [place] - [address] [phone] (Phone) [phone] (Fax)

## 2017-05-03 NOTE — Telephone Encounter (Signed)
Please advise 

## 2017-05-24 ENCOUNTER — Telehealth: Payer: Self-pay | Admitting: Family Medicine

## 2017-05-24 ENCOUNTER — Other Ambulatory Visit: Payer: Self-pay

## 2017-05-24 DIAGNOSIS — L255 Unspecified contact dermatitis due to plants, except food: Secondary | ICD-10-CM

## 2017-05-24 MED ORDER — PREDNISONE 10 MG PO TABS: 10.0000 mg | ORAL_TABLET | ORAL | 0 refills | Status: DC

## 2017-05-24 NOTE — Telephone Encounter (Signed)
Pt states that she has poison Karlene Einstein and is out of town due to the death of her mother, pt states that is does not have time to go to an Urgent Care and asking if KT could call something in for her, [facility] ph # [phone], or advise pt on what she could do.

## 2017-05-24 NOTE — Telephone Encounter (Signed)
How extensive is the distribution of poison ivy?  If she just has small areas on arms/legs, using OTC benadryl cream or hydrocortisone cream as needed for itching (and oral benadryl if needed) is appropriate.  If large distribution or involving face, we can start Prednisone taper (10mg , 3x3 days, 2x3days, 1x3 days, #18)

## 2017-05-24 NOTE — Telephone Encounter (Signed)
Spoke with patient regarding symptoms. Patient reports several large areas of poison ivy on elbows, wrist, under arm and small area on her face. Prednisone taper sent to [facility] in [place], [place]. Advised to make appointment upon return if symptoms persist. Patient verbalized understanding.

## 2017-05-28 ENCOUNTER — Ambulatory Visit (INDEPENDENT_AMBULATORY_CARE_PROVIDER_SITE_OTHER): Payer: 59 | Admitting: Physician Assistant

## 2017-05-28 ENCOUNTER — Encounter: Payer: Self-pay | Admitting: Physician Assistant

## 2017-05-28 VITALS — BP 122/84 | HR 55 | Temp 98.6°F | Resp 14 | Ht 66.0 in | Wt 299.0 lb

## 2017-05-28 DIAGNOSIS — L237 Allergic contact dermatitis due to plants, except food: Secondary | ICD-10-CM

## 2017-05-28 DIAGNOSIS — L03119 Cellulitis of unspecified part of limb: Secondary | ICD-10-CM

## 2017-05-28 DIAGNOSIS — L255 Unspecified contact dermatitis due to plants, except food: Secondary | ICD-10-CM

## 2017-05-28 MED ORDER — DOXYCYCLINE HYCLATE 100 MG PO CAPS: 100.0000 mg | ORAL_CAPSULE | Freq: Two times a day (BID) | ORAL | 0 refills | Status: DC

## 2017-05-28 MED FILL — DOXYCYCLINE HYCLATE 100 MG: 100 | 7 days supply | Qty: 14 | Fill #0

## 2017-05-28 NOTE — Progress Notes (Signed)
Pre visit review using our clinic review tool, if applicable. No additional management support is needed unless otherwise documented below in the visit note. 

## 2017-05-28 NOTE — Patient Instructions (Signed)
Please continue the prednisone. Keep skin clean and dry. Start the antibiotic and take as directed. Cool compresses to the area should also be beneficial.  Follow-up with me or Dr. Birdie Riddle Thursday or Friday. If not improving over the next 48 hours, or if anything worsens, please go to the ER.

## 2017-05-28 NOTE — Progress Notes (Signed)
Patient presents to clinic today c/o pruritic rash of flexural surfaces of arms bilaterally. Patient states rash started in left antecubital region after exposure to poison oak. Rash was initially blistery and linear in each area. Patient states she was started on prednisone by her primary care provider. Is currently on a taper pack. Endorses crusting and resolution of vesicles. Has now been left with a large area of erythema and tenderness. Denies fever, chills, malaise or fatigue. Denies insect bite or sting. Denies trauma. Denies recent travel or sick contact.  Past Medical History:  Diagnosis Date  . Hypertension   . Thyroid disease     Current Outpatient Prescriptions on File Prior to Visit  Medication Sig Dispense Refill  . cetirizine (ZYRTEC) 10 MG tablet Take 10 mg by mouth daily.    Marland Kitchen EPINEPHrine 0.3 mg/0.3 mL IJ SOAJ injection Inject 0.3 mLs (0.3 mg total) into the muscle once. 2 Device 1  . levothyroxine (SYNTHROID, LEVOTHROID) 175 MCG tablet Take 1 tablet (175 mcg total) by mouth daily before breakfast. 30 tablet 3  . predniSONE (DELTASONE) 10 MG tablet Take 1 tablet (10 mg total) by mouth as directed. 1 tablet three times/day x 3 days, then 1 tablet twice a day x 3 days, then  1 tablet daily x 3 days. 18 tablet 0  . ranitidine (ZANTAC) 150 MG tablet Take 150 mg by mouth daily.    . Vitamin D, Ergocalciferol, (DRISDOL) 50000 units CAPS capsule Take 1 capsule (50,000 Units total) by mouth every 7 (seven) days. 12 capsule 0   No current facility-administered medications on file prior to visit.     Allergies  Allergen Reactions  . Penicillins     Allergy since childhood/unknown reaction  . Sulfa Antibiotics Rash    Family History  Problem Relation Age of Onset  . Hypertension Mother   . Aortic aneurysm Mother   . Cancer Mother        pancreatic cancer  . Heart disease Father   . Hyperlipidemia Brother   . Hypertension Brother   . Diabetes Maternal Grandmother   .  Heart disease Maternal Grandmother   . Cancer Maternal Grandmother        reproductive   . Liver disease Maternal Grandfather   . Stroke Paternal Grandmother     Social History   Social History  . Marital status: Married    Spouse name: [name]  . Number of children: 3  . Years of education: RN   Occupational History  . RN [employer]   Social History Main Topics  . Smoking status: Never Smoker  . Smokeless tobacco: Never Used  . Alcohol use 0.0 oz/week  . Drug use: No  . Sexual activity: Not Asked   Other Topics Concern  . None   Social History Narrative   Lives with her husband and their daughter.  Their sons (twins) stayed in [place] when they moved here June 2014.   Review of Systems - See HPI.  All other ROS are negative.  BP 122/84   Pulse (!) 55   Temp 98.6 F (37 C) (Oral)   Resp 14   Ht 5\' 6"  (1.676 m)   Wt 299 lb (135.6 kg)   SpO2 98%   BMI 48.26 kg/m   Physical Exam  Constitutional: She is oriented to person, place, and time and well-developed, well-nourished, and in no distress.  HENT:  Head: Normocephalic and atraumatic.  Eyes: Conjunctivae are normal.  Neck: Neck supple.  Cardiovascular: Normal rate, regular rhythm, normal heart sounds and intact distal pulses.   Neurological: She is alert and oriented to person, place, and time.  Skin: Skin is warm and dry.     Psychiatric: Affect normal.  Vitals reviewed.   Recent Results (from the past 2160 hour(s))  TSH     Status: Abnormal   Collection Time: 03/23/17  9:05 AM  Result Value Ref Range   TSH 0.18 (L) 0.35 - 4.50 uIU/mL  TSH     Status: Abnormal   Collection Time: 05/02/17  1:05 PM  Result Value Ref Range   TSH 0.23 (L) 0.35 - 4.50 uIU/mL    Assessment/Plan: 1. Cellulitis of upper extremity, unspecified laterality Start doxycycline. Supportive measures reviewed. Strict return precautions and ER precautions reviewed with patient. Follow-up scheduled for  Thursday/Friday this week.  2. Rhus dermatitis Continue steroid taper as dermatitis is responding well except for secondary bacterial infection. Treatment for cellulitis as above. Follow-up scheduled.    Leeanne Rio, PA-C

## 2017-05-31 NOTE — Progress Notes (Signed)
Patient presents to clinic today for follow-up of rhus dermatitis and cellulitis of upper extremities bilaterally. At last visit, patient was continued on her prednisone taper to complete entire course. Also started on Doxycycline for cellulitis. Patient endorses taking both medications as directed. Notes resolution of cellulitis in the upper extremities along with continued resolution of her dermatitis. Denies fever, chills, malaise or fatigue. Has noted two new spots of dermatitis of her left forearm. Denies new lesions elsewhere. Has a few more days of antibiotic to complete but has completed entire course of steroid.    Past Medical History:  Diagnosis Date  . Hypertension   . Thyroid disease     Current Outpatient Prescriptions on File Prior to Visit  Medication Sig Dispense Refill  . cetirizine (ZYRTEC) 10 MG tablet Take 10 mg by mouth daily.    Marland Kitchen doxycycline (VIBRAMYCIN) 100 MG capsule Take 1 capsule (100 mg total) by mouth 2 (two) times daily. 14 capsule 0  . EPINEPHrine 0.3 mg/0.3 mL IJ SOAJ injection Inject 0.3 mLs (0.3 mg total) into the muscle once. 2 Device 1  . levothyroxine (SYNTHROID, LEVOTHROID) 175 MCG tablet Take 1 tablet (175 mcg total) by mouth daily before breakfast. 30 tablet 3  . ranitidine (ZANTAC) 150 MG tablet Take 150 mg by mouth daily.    . Vitamin D, Ergocalciferol, (DRISDOL) 50000 units CAPS capsule Take 1 capsule (50,000 Units total) by mouth every 7 (seven) days. 12 capsule 0   No current facility-administered medications on file prior to visit.     Allergies  Allergen Reactions  . Penicillins     Allergy since childhood/unknown reaction  . Sulfa Antibiotics Rash    Family History  Problem Relation Age of Onset  . Hypertension Mother   . Aortic aneurysm Mother   . Cancer Mother        pancreatic cancer  . Heart disease Father   . Hyperlipidemia Brother   . Hypertension Brother   . Diabetes Maternal Grandmother   . Heart disease Maternal  Grandmother   . Cancer Maternal Grandmother        reproductive   . Liver disease Maternal Grandfather   . Stroke Paternal Grandmother     Social History   Social History  . Marital status: Married    Spouse name: [name]  . Number of children: 3  . Years of education: RN   Occupational History  . RN [employer]   Social History Main Topics  . Smoking status: Never Smoker  . Smokeless tobacco: Never Used  . Alcohol use 0.0 oz/week  . Drug use: No  . Sexual activity: Not Asked   Other Topics Concern  . None   Social History Narrative   Lives with her husband and their daughter.  Their sons (twins) stayed in [place] when they moved here June 2014.   Review of Systems - See HPI.  All other ROS are negative.  BP (!) 140/98   Pulse 66   Temp 98.3 F (36.8 C) (Oral)   Resp 16   Ht 5\' 6"  (1.676 m)   Wt 299 lb (135.6 kg)   SpO2 98%   BMI 48.26 kg/m   Physical Exam  Constitutional: She is oriented to person, place, and time and well-developed, well-nourished, and in no distress.  HENT:  Head: Normocephalic and atraumatic.  Eyes: Conjunctivae are normal.  Neck: Neck supple.  Cardiovascular: Normal rate, regular rhythm, normal heart sounds and intact  distal pulses.   Pulmonary/Chest: Effort normal and breath sounds normal. No respiratory distress. She has no wheezes. She has no rales. She exhibits no tenderness.  Neurological: She is alert and oriented to person, place, and time. No cranial nerve deficit.  Skin: Skin is warm and dry.     Psychiatric: Affect normal.  Vitals reviewed.   Recent Results (from the past 2160 hour(s))  TSH     Status: Abnormal   Collection Time: 03/23/17  9:05 AM  Result Value Ref Range   TSH 0.18 (L) 0.35 - 4.50 uIU/mL  TSH     Status: Abnormal   Collection Time: 05/02/17  1:05 PM  Result Value Ref Range   TSH 0.23 (L) 0.35 - 4.50 uIU/mL    Assessment/Plan: Dermatitis Cellulitis has resolved. Finish course  of doxycycline. Initial areas of rhus dermatitis are resolved. There is one new area of R forearm. Start topical Clobetasol to the area. Dermatology if symptoms are not completely resolving.     Leeanne Rio, PA-C

## 2017-06-01 ENCOUNTER — Ambulatory Visit (INDEPENDENT_AMBULATORY_CARE_PROVIDER_SITE_OTHER): Payer: 59 | Admitting: Physician Assistant

## 2017-06-01 ENCOUNTER — Encounter: Payer: Self-pay | Admitting: Physician Assistant

## 2017-06-01 VITALS — BP 140/98 | HR 66 | Temp 98.3°F | Resp 16 | Ht 66.0 in | Wt 299.0 lb

## 2017-06-01 DIAGNOSIS — L309 Dermatitis, unspecified: Secondary | ICD-10-CM | POA: Diagnosis not present

## 2017-06-01 MED ORDER — CLOBETASOL PROPIONATE 0.05 % EX CREA: 1.0000 "application " | TOPICAL_CREAM | Freq: Two times a day (BID) | CUTANEOUS | 0 refills | Status: DC

## 2017-06-01 NOTE — Progress Notes (Signed)
Pre visit review using our clinic review tool, if applicable. No additional management support is needed unless otherwise documented below in the visit note. 

## 2017-06-01 NOTE — Patient Instructions (Signed)
Finish entire course of antibiotic. Stop Zyrtec for now. Take a Claritin each AM and a Benadryl each PM. Continue Zantac as directed.  Apply clobetasol as directed to the areas discussed.  Follow-up Monday in the lab for repeat blood work. If symptoms are not resolving we may need a dermatologist on board. Thankfully the cellulitis is most all resolved!

## 2017-06-04 ENCOUNTER — Other Ambulatory Visit (INDEPENDENT_AMBULATORY_CARE_PROVIDER_SITE_OTHER): Payer: 59

## 2017-06-04 DIAGNOSIS — E039 Hypothyroidism, unspecified: Secondary | ICD-10-CM | POA: Diagnosis not present

## 2017-06-04 LAB — TSH: TSH: 3.49 u[IU]/mL (ref 0.35–4.50)

## 2017-06-04 NOTE — Assessment & Plan Note (Signed)
Cellulitis has resolved. Finish course of doxycycline. Initial areas of rhus dermatitis are resolved. There is one new area of R forearm. Start topical Clobetasol to the area. Dermatology if symptoms are not completely resolving.

## 2017-06-06 MED FILL — GABAPENTIN 100 MG CAP: 100 | 30 days supply | Qty: 60 | Fill #2

## 2017-06-07 MED FILL — LEVOTHYROXINE 175 MCG TABLE: 175 | 30 days supply | Qty: 30 | Fill #1

## 2017-07-18 MED FILL — LEVOTHYROXINE 175 MCG TABLE: 175 | 30 days supply | Qty: 30 | Fill #2

## 2017-08-14 ENCOUNTER — Ambulatory Visit: Payer: 59 | Admitting: Family Medicine

## 2017-08-29 MED FILL — LEVOTHYROXINE 175 MCG TABLE: 175 | 30 days supply | Qty: 30 | Fill #3

## 2017-10-05 ENCOUNTER — Other Ambulatory Visit: Payer: Self-pay | Admitting: Family Medicine

## 2017-10-05 ENCOUNTER — Encounter: Payer: Self-pay | Admitting: Family Medicine

## 2017-10-05 DIAGNOSIS — Z1231 Encounter for screening mammogram for malignant neoplasm of breast: Secondary | ICD-10-CM

## 2017-10-09 ENCOUNTER — Ambulatory Visit: Payer: 59 | Admitting: Family Medicine

## 2017-10-17 ENCOUNTER — Other Ambulatory Visit: Payer: Self-pay | Admitting: Family Medicine

## 2017-10-17 MED ORDER — LEVOTHYROXINE SODIUM 175 MCG PO TABS: 175.0000 ug | ORAL_TABLET | Freq: Every day | ORAL | 3 refills | Status: DC

## 2017-10-17 MED FILL — LEVOTHYROXINE 175 MCG TABLE: 175 | 90 days supply | Qty: 90 | Fill #0 | Status: TO

## 2017-10-17 NOTE — Telephone Encounter (Signed)
Copied from [place] [id number]. Topic: Quick Communication - See Telephone Encounter >> Oct 17, 2017  1:53 PM Synthia Innocent wrote: CRM for notification. See Telephone encounter for:  Requesting 90 day supply for levothyroxine (SYNTHROID, LEVOTHROID) 175 MCG tablet, [place] 10/17/17.

## 2017-10-17 NOTE — Telephone Encounter (Signed)
Copied from [place] [id number]. Topic: Quick Communication - See Telephone Encounter >> Oct 17, 2017  1:53 PM Synthia Innocent wrote: CRM for notification. See Telephone encounter for:  Requesting 90 day supply for levothyroxine (SYNTHROID, LEVOTHROID) 175 MCG tablet, [place] 10/17/17.

## 2017-10-18 ENCOUNTER — Ambulatory Visit
Admission: RE | Admit: 2017-10-18 | Discharge: 2017-10-18 | Disposition: A | Discharge status: Home / Self Care | Payer: 59 | Source: Ambulatory Visit | Attending: Family Medicine | Admitting: Family Medicine

## 2017-10-18 DIAGNOSIS — Z1231 Encounter for screening mammogram for malignant neoplasm of breast: Secondary | ICD-10-CM

## 2017-10-18 IMAGING — MG 2D DIGITAL SCREENING BILATERAL MAMMOGRAM WITH CAD AND ADJUNCT TO
8 of 12 series · 8 of 28 positions shown · non-contrast
Comparison: Previous exam(s).

CLINICAL DATA: Screening.

EXAM:
2D DIGITAL SCREENING BILATERAL MAMMOGRAM WITH CAD AND ADJUNCT TOMO

[L MLO synth-2D]
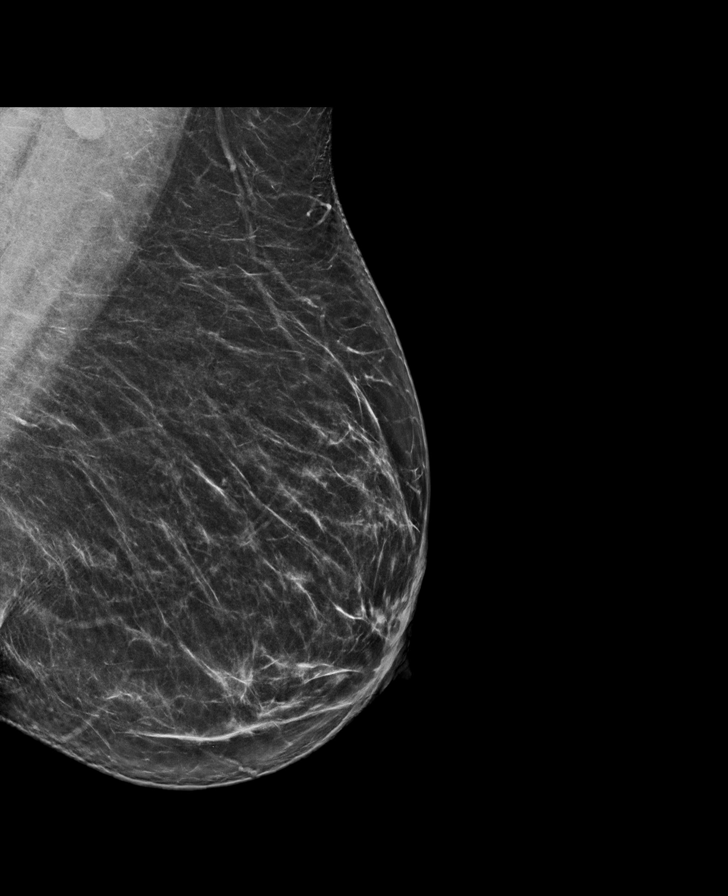

[L CC]
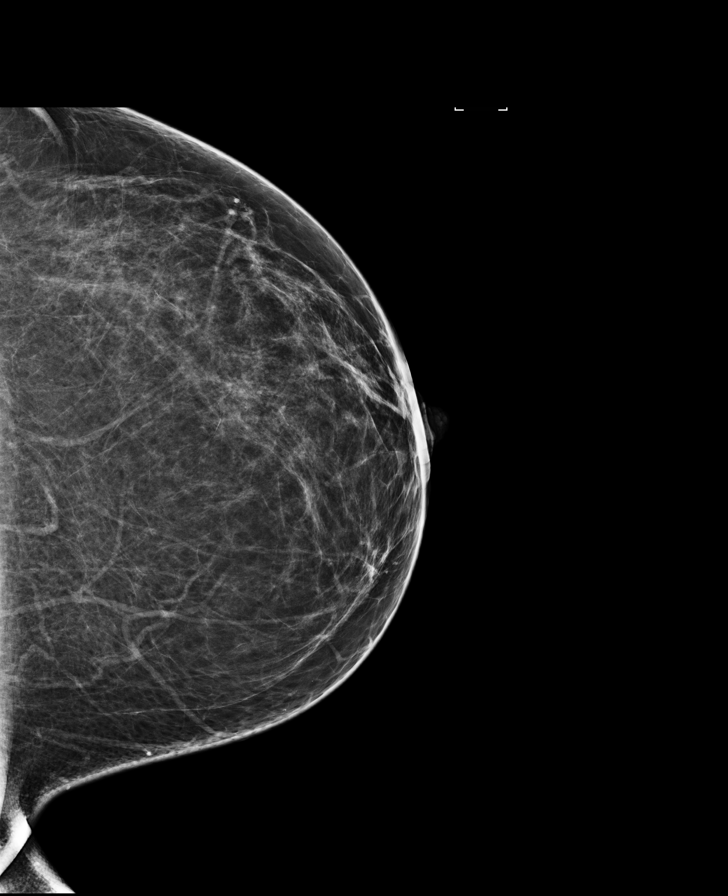

[R MLO synth-2D]
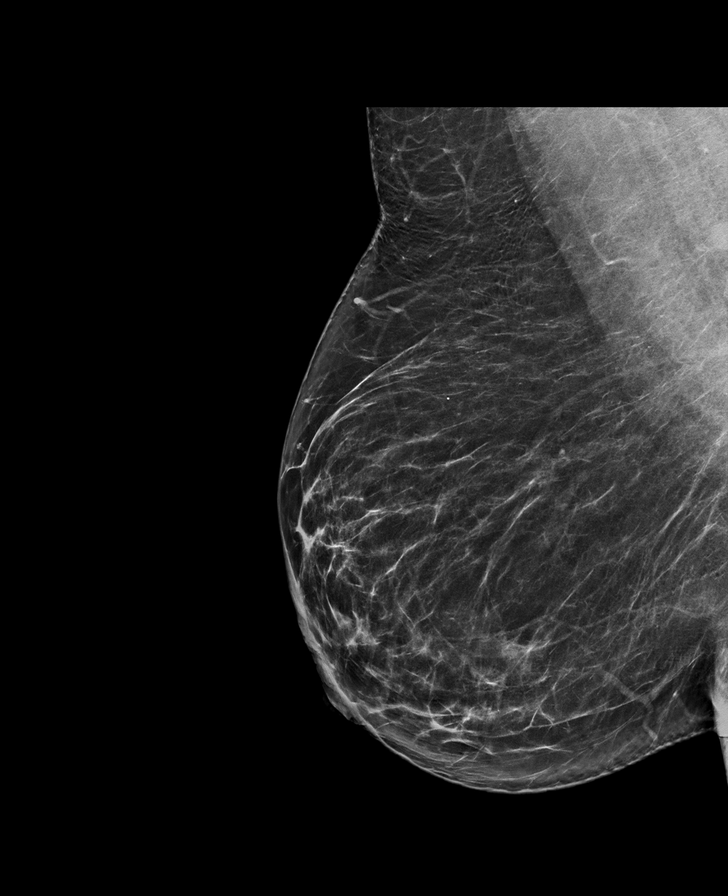

[R CC]
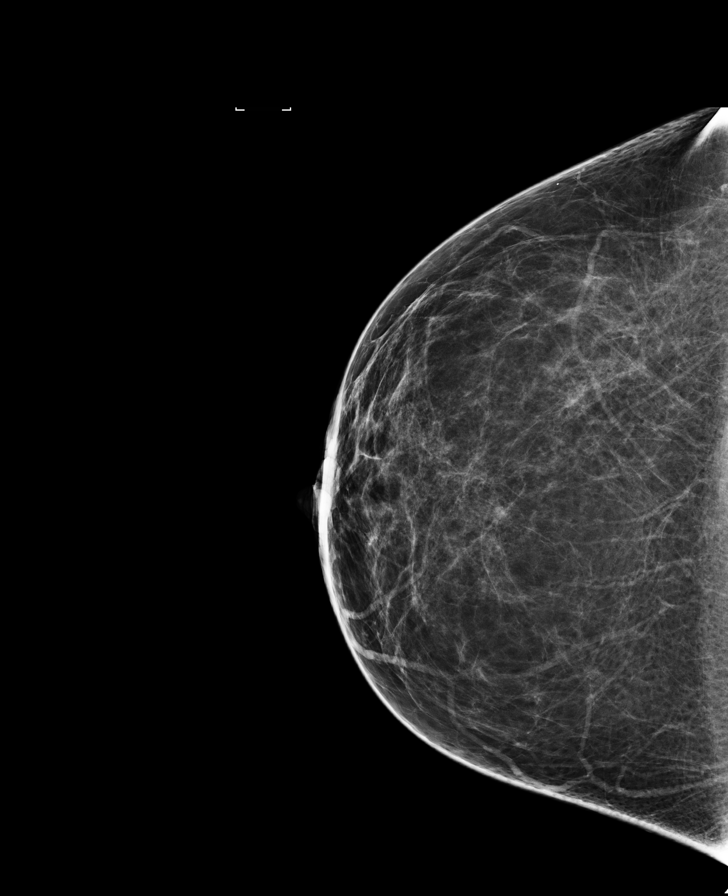

[R CC synth-2D]
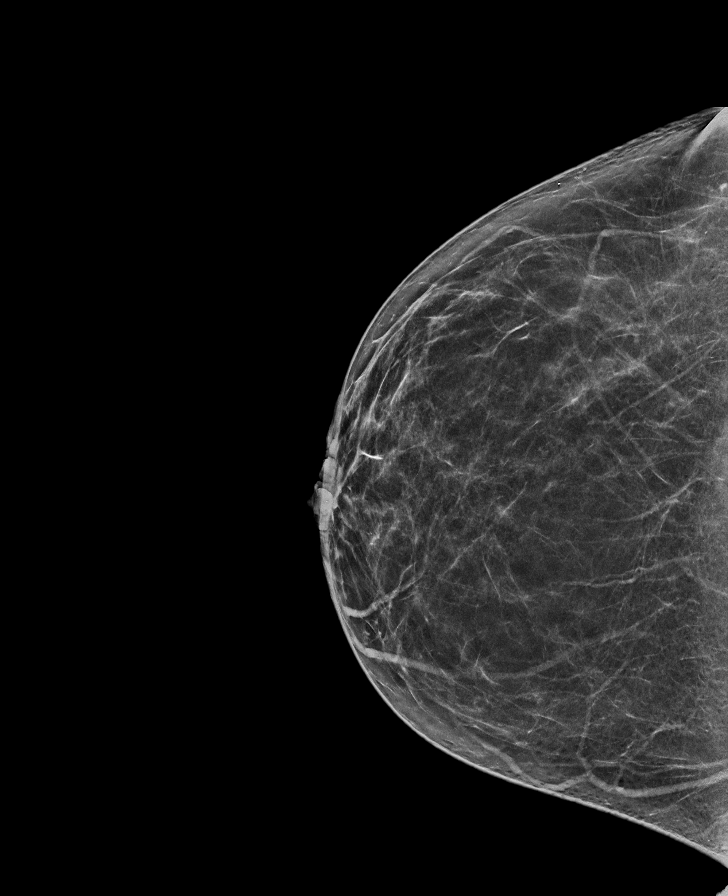

[R MLO]
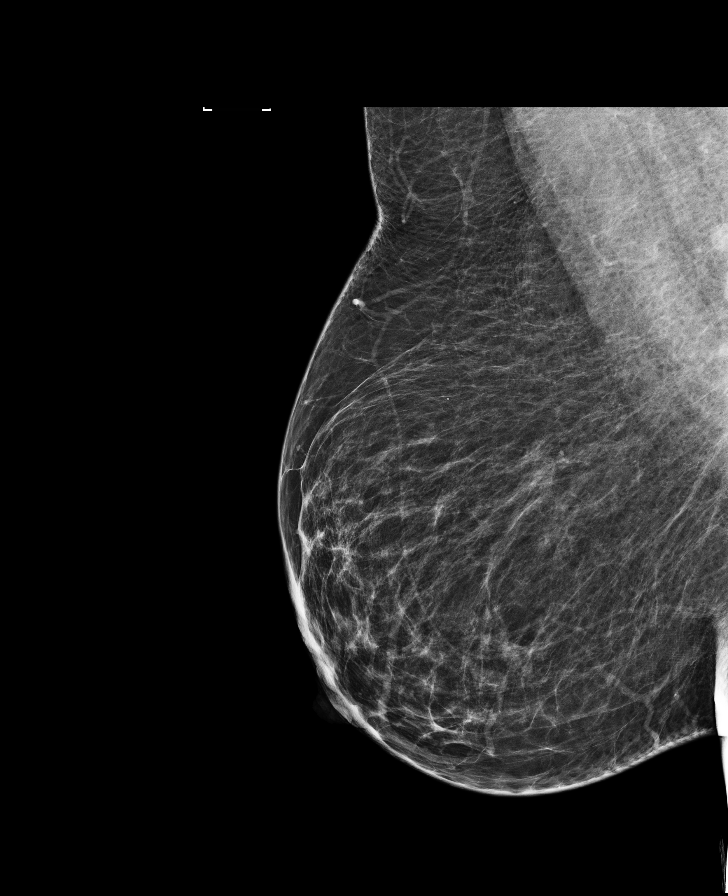

[L MLO]
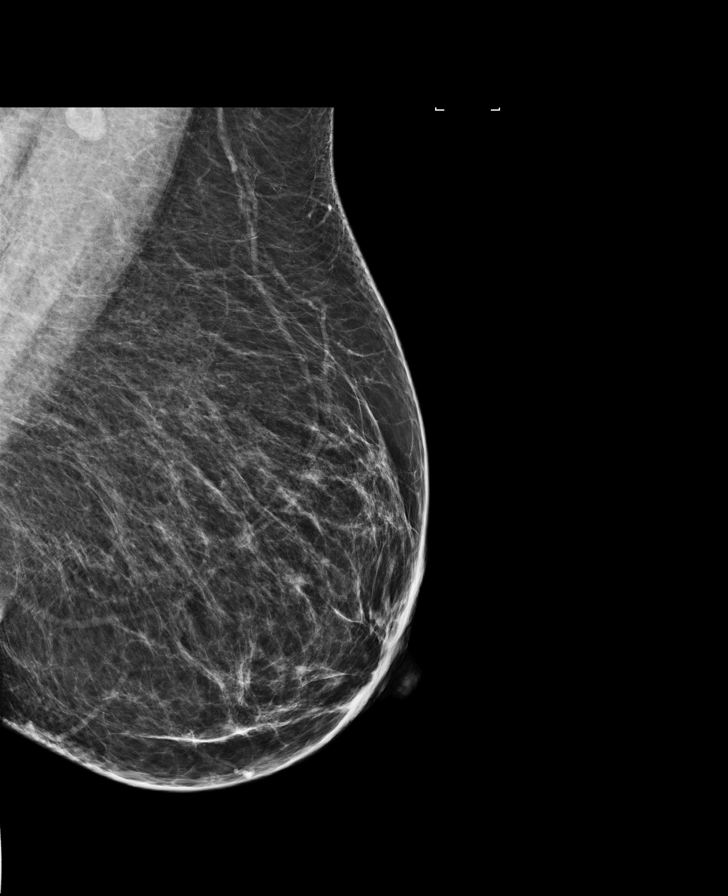

[L CC synth-2D]
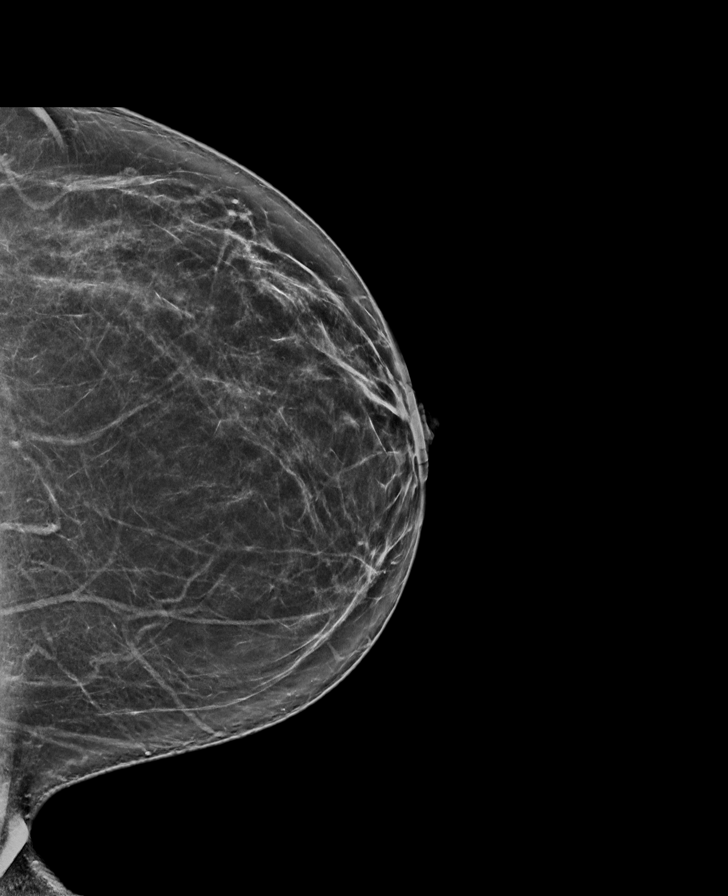

[8 of 28 positions shown; findings below may reference images not displayed]

ACR Breast Density Category b: There are scattered areas of
fibroglandular density.
FINDINGS: There are no findings suspicious for malignancy. Images were
processed with CAD.
IMPRESSION: No mammographic evidence of malignancy. A result letter of this
screening mammogram will be mailed directly to the patient.

RECOMMENDATION:
Screening mammogram in one year. (Code:[33])

BI-RADS CATEGORY  1: Negative.

## 2017-11-14 ENCOUNTER — Ambulatory Visit: Payer: 59

## 2018-01-08 MED FILL — PREVIDENT 5000 BOOSTER PLUS: 1.1 | 30 days supply | Qty: 100 | Fill #0

## 2018-03-22 ENCOUNTER — Other Ambulatory Visit: Payer: Self-pay | Admitting: General Practice

## 2018-03-22 ENCOUNTER — Encounter: Payer: Self-pay | Admitting: Family Medicine

## 2018-03-22 MED ORDER — LEVOTHYROXINE SODIUM 175 MCG PO TABS: 175.0000 ug | ORAL_TABLET | Freq: Every day | ORAL | 3 refills | Status: DC

## 2018-06-03 ENCOUNTER — Other Ambulatory Visit: Payer: Self-pay | Admitting: Nurse Practitioner

## 2018-06-03 ENCOUNTER — Other Ambulatory Visit: Payer: Self-pay | Admitting: Internal Medicine

## 2018-06-03 DIAGNOSIS — R030 Elevated blood-pressure reading, without diagnosis of hypertension: Secondary | ICD-10-CM | POA: Diagnosis not present

## 2018-06-03 MED ORDER — TRIAMTERENE-HCTZ 37.5-25 MG PO TABS: 1.0000 | ORAL_TABLET | Freq: Every day | ORAL | 11 refills | Status: DC

## 2018-06-03 MED FILL — TRIAMTERENE/HCTZ 37.5/25 TB: 37.5-25 | 30 days supply | Qty: 30 | Fill #0

## 2018-06-04 LAB — CBC WITH DIFFERENTIAL/PLATELET
BASOS: 0 %
Basophils Absolute: 0 10*3/uL (ref 0.0–0.2)
EOS (ABSOLUTE): 0.2 10*3/uL (ref 0.0–0.4)
Eos: 2 %
Hematocrit: 35.1 % (ref 34.0–46.6)
Hemoglobin: 11 g/dL — ABNORMAL LOW (ref 11.1–15.9)
Immature Grans (Abs): 0 10*3/uL (ref 0.0–0.1)
Immature Granulocytes: 0 %
LYMPHS ABS: 3.6 10*3/uL — AB (ref 0.7–3.1)
Lymphs: 38 %
MCH: 25.7 pg — AB (ref 26.6–33.0)
MCHC: 31.3 g/dL — AB (ref 31.5–35.7)
MCV: 82 fL (ref 79–97)
Monocytes Absolute: 0.6 10*3/uL (ref 0.1–0.9)
Monocytes: 6 %
NEUTROS ABS: 5 10*3/uL (ref 1.4–7.0)
Neutrophils: 54 %
Platelets: 326 10*3/uL (ref 150–450)
RBC: 4.28 x10E6/uL (ref 3.77–5.28)
RDW: 15.3 % (ref 12.3–15.4)
WBC: 9.4 10*3/uL (ref 3.4–10.8)

## 2018-06-04 LAB — BASIC METABOLIC PANEL
BUN / CREAT RATIO: 14 (ref 9–23)
BUN: 12 mg/dL (ref 6–24)
CHLORIDE: 98 mmol/L (ref 96–106)
CO2: 21 mmol/L (ref 20–29)
Calcium: 9 mg/dL (ref 8.7–10.2)
Creatinine, Ser: 0.86 mg/dL (ref 0.57–1.00)
GFR calc Af Amer: 92 mL/min/{1.73_m2} (ref >59)
GFR calc non Af Amer: 80 mL/min/{1.73_m2} (ref >59)
GLUCOSE: 55 mg/dL — AB (ref 65–99)
POTASSIUM: 4.1 mmol/L (ref 3.5–5.2)
SODIUM: 139 mmol/L (ref 134–144)

## 2018-06-04 LAB — TSH: TSH: 6.3 u[IU]/mL — AB (ref 0.450–4.500)

## 2018-06-05 MED FILL — AMLODIPINE BESYLATE 5 MG TA: 5 | 30 days supply | Qty: 30 | Fill #0

## 2018-06-06 ENCOUNTER — Telehealth: Payer: Self-pay | Admitting: Internal Medicine

## 2018-06-06 NOTE — Telephone Encounter (Signed)
Patient working Monday   Felt weak, dizzy BP taken   BP 180/110 (L arm)    Repeat BP checks remained high     Exam:   BP 180/110  P60s    Neck JVP normal Lungs CTA    Cardiac exam:   RRR   No murmurs   S1, S2 normal Ext without edema  Aldactone given   12.5 mg    BP 1 hour later 160/110    Pt feeling some better   Monday 5 PM  Rx called for Maxzide 37.5/25      Pt took 1 tab that evening     BP 142/82  P 50s   Feeling better  Next afternoon(Tuesday)   BP 143/86   Feeling fine. Called in Tuesday  evening   Said feeling wiped out  Throat and eyes felt swollen   HA ? Allergic Rxn to maxzide  Stopped  BP yesterday (Wed)   132/84   Rx called in for amlodipine 5 mg    Recomm to continue to follow BP   If climbed to 150/ recomm starting at 2.5 mg     Will continue to follow

## 2018-06-06 NOTE — Telephone Encounter (Signed)
Error. See other telephone note.  

## 2018-06-14 ENCOUNTER — Telehealth: Payer: Self-pay | Admitting: Internal Medicine

## 2018-06-14 DIAGNOSIS — I10 Essential (primary) hypertension: Secondary | ICD-10-CM

## 2018-06-14 MED ORDER — AMLODIPINE BESYLATE 2.5 MG PO TABS: 2.5000 mg | ORAL_TABLET | Freq: Every day | ORAL | 0 refills | Status: DC

## 2018-06-14 MED ORDER — SPIRONOLACTONE 25 MG PO TABS: 25.0000 mg | ORAL_TABLET | Freq: Every day | ORAL | 11 refills | Status: DC

## 2018-06-14 MED FILL — SPIRONOLACTONE 25 MG TABLET: 25 | 90 days supply | Qty: 90 | Fill #0

## 2018-06-14 NOTE — Addendum Note (Signed)
Addended by: Emmaline Life on: 06/14/2018 12:03 PM   Modules accepted: Orders

## 2018-06-14 NOTE — Telephone Encounter (Signed)
Plan of care reviewed with patient who verbalized agreement and understanding. She is scheduled for lab appointment on Monday 8/19 for bmet. Patient will monitor BP and report.

## 2018-06-14 NOTE — Telephone Encounter (Signed)
Spoke to patient yesterday    Has noticed ankles are tight  Uncomfortable  Did better on HCTZ with 7lb wt loss  But had swelling on this (allergic Rxn) ON amlodipine now  I have spoken with Sundra Aland PharmD Would reocmm trial of aldactone 25 mg  Daily Check BMET in 7 to 10 days     Hold amlodipine   May end up needing 2.5 mg    Follow BP

## 2018-06-24 ENCOUNTER — Other Ambulatory Visit: Payer: 59 | Admitting: *Deleted

## 2018-06-24 DIAGNOSIS — I10 Essential (primary) hypertension: Secondary | ICD-10-CM | POA: Diagnosis not present

## 2018-06-25 LAB — BASIC METABOLIC PANEL
BUN / CREAT RATIO: 9 (ref 9–23)
BUN: 8 mg/dL (ref 6–24)
CHLORIDE: 101 mmol/L (ref 96–106)
CO2: 24 mmol/L (ref 20–29)
Calcium: 9.2 mg/dL (ref 8.7–10.2)
Creatinine, Ser: 0.85 mg/dL (ref 0.57–1.00)
GFR calc Af Amer: 93 mL/min/{1.73_m2} (ref >59)
GFR calc non Af Amer: 81 mL/min/{1.73_m2} (ref >59)
GLUCOSE: 92 mg/dL (ref 65–99)
POTASSIUM: 4.4 mmol/L (ref 3.5–5.2)
SODIUM: 140 mmol/L (ref 134–144)

## 2018-07-03 ENCOUNTER — Other Ambulatory Visit: Payer: 59 | Admitting: *Deleted

## 2018-07-03 ENCOUNTER — Telehealth: Payer: Self-pay | Admitting: Pharmacist

## 2018-07-03 DIAGNOSIS — I10 Essential (primary) hypertension: Secondary | ICD-10-CM | POA: Diagnosis not present

## 2018-07-03 DIAGNOSIS — R030 Elevated blood-pressure reading, without diagnosis of hypertension: Secondary | ICD-10-CM

## 2018-07-03 LAB — BASIC METABOLIC PANEL
BUN/Creatinine Ratio: 11 (ref 9–23)
BUN: 9 mg/dL (ref 6–24)
CALCIUM: 9.1 mg/dL (ref 8.7–10.2)
CO2: 22 mmol/L (ref 20–29)
CREATININE: 0.84 mg/dL (ref 0.57–1.00)
Chloride: 100 mmol/L (ref 96–106)
GFR calc Af Amer: 94 mL/min/{1.73_m2} (ref >59)
GFR, EST NON AFRICAN AMERICAN: 82 mL/min/{1.73_m2} (ref >59)
Glucose: 92 mg/dL (ref 65–99)
Potassium: 4.5 mmol/L (ref 3.5–5.2)
Sodium: 135 mmol/L (ref 134–144)

## 2018-07-03 MED ORDER — SPIRONOLACTONE 50 MG PO TABS: 50.0000 mg | ORAL_TABLET | Freq: Every day | ORAL | 3 refills | Status: DC

## 2018-07-03 NOTE — Telephone Encounter (Signed)
Pt has been taking spironolactone 50mg  daily and will need a BMET. Pressure today is elevated at 142/100. She feels well overall. Will await labs results to see if medication adjustments are warranted.

## 2018-08-08 ENCOUNTER — Ambulatory Visit: Payer: 59 | Admitting: Family Medicine

## 2018-08-08 ENCOUNTER — Encounter

## 2018-08-08 MED FILL — SPIRONOLACTONE 50 MG TABLET: 50 | 90 days supply | Qty: 90 | Fill #0

## 2018-08-14 ENCOUNTER — Encounter: Payer: 59 | Admitting: Family Medicine

## 2018-08-14 DIAGNOSIS — H524 Presbyopia: Secondary | ICD-10-CM | POA: Diagnosis not present

## 2018-09-03 ENCOUNTER — Other Ambulatory Visit: Payer: Self-pay

## 2018-09-03 ENCOUNTER — Other Ambulatory Visit: Payer: 59

## 2018-09-03 DIAGNOSIS — E039 Hypothyroidism, unspecified: Secondary | ICD-10-CM

## 2018-09-03 DIAGNOSIS — I10 Essential (primary) hypertension: Secondary | ICD-10-CM | POA: Diagnosis not present

## 2018-09-03 DIAGNOSIS — R030 Elevated blood-pressure reading, without diagnosis of hypertension: Secondary | ICD-10-CM

## 2018-09-03 DIAGNOSIS — R946 Abnormal results of thyroid function studies: Secondary | ICD-10-CM | POA: Diagnosis not present

## 2018-09-03 NOTE — Progress Notes (Signed)
bmet  

## 2018-09-04 ENCOUNTER — Other Ambulatory Visit: Payer: Self-pay | Admitting: General Practice

## 2018-09-04 ENCOUNTER — Telehealth: Payer: Self-pay

## 2018-09-04 DIAGNOSIS — E039 Hypothyroidism, unspecified: Secondary | ICD-10-CM

## 2018-09-04 LAB — BASIC METABOLIC PANEL
BUN / CREAT RATIO: 12 (ref 9–23)
BUN: 10 mg/dL (ref 6–24)
CO2: 20 mmol/L (ref 20–29)
CREATININE: 0.83 mg/dL (ref 0.57–1.00)
Calcium: 9.4 mg/dL (ref 8.7–10.2)
Chloride: 101 mmol/L (ref 96–106)
GFR calc Af Amer: 96 mL/min/{1.73_m2} (ref >59)
GFR, EST NON AFRICAN AMERICAN: 83 mL/min/{1.73_m2} (ref >59)
Glucose: 85 mg/dL (ref 65–99)
Potassium: 4.3 mmol/L (ref 3.5–5.2)
SODIUM: 135 mmol/L (ref 134–144)

## 2018-09-04 LAB — TSH: TSH: 10.24 u[IU]/mL — ABNORMAL HIGH (ref 0.450–4.500)

## 2018-09-04 MED ORDER — LEVOTHYROXINE SODIUM 200 MCG PO TABS: 200.0000 ug | ORAL_TABLET | Freq: Every day | ORAL | 3 refills | Status: DC

## 2018-09-04 NOTE — Telephone Encounter (Signed)
Dr. Harrington Challenger discussed results with patient. Free T3 and free T4 added to labs. Confirmed with Sholanda at [employer].

## 2018-09-04 NOTE — Telephone Encounter (Signed)
-----   Message from Fay Records, MD sent at 09/04/2018 10:54 AM EDT ----- Check free T3, free T4

## 2018-09-05 ENCOUNTER — Other Ambulatory Visit: Payer: Self-pay | Admitting: General Practice

## 2018-09-05 MED ORDER — LEVOTHYROXINE SODIUM 175 MCG PO TABS: 175.0000 ug | ORAL_TABLET | Freq: Every day | ORAL | 3 refills | Status: DC

## 2018-09-11 LAB — SPECIMEN STATUS REPORT

## 2018-09-11 LAB — T4, FREE: Free T4: 1.2 ng/dL (ref 0.82–1.77)

## 2018-09-11 LAB — T3, FREE: T3 FREE: 2.5 pg/mL (ref 2.0–4.4)

## 2018-10-11 ENCOUNTER — Ambulatory Visit: Payer: Self-pay

## 2018-10-11 ENCOUNTER — Ambulatory Visit: Payer: Self-pay | Admitting: Obstetrics and Gynecology

## 2018-10-11 VITALS — BP 124/82 | HR 104 | Temp 98.7°F | Resp 18 | Wt 294.4 lb

## 2018-10-11 DIAGNOSIS — R109 Unspecified abdominal pain: Secondary | ICD-10-CM | POA: Insufficient documentation

## 2018-10-11 DIAGNOSIS — R112 Nausea with vomiting, unspecified: Secondary | ICD-10-CM | POA: Insufficient documentation

## 2018-10-11 DIAGNOSIS — A09 Infectious gastroenteritis and colitis, unspecified: Secondary | ICD-10-CM | POA: Insufficient documentation

## 2018-10-11 LAB — POCT URINALYSIS DIPSTICK
Bilirubin, UA: NEGATIVE
Glucose, UA: NEGATIVE
KETONES UA: NEGATIVE
Leukocytes, UA: NEGATIVE
NITRITE UA: NEGATIVE
PH UA: 7 (ref 5.0–8.0)
PROTEIN UA: NEGATIVE
RBC UA: NEGATIVE
Spec Grav, UA: 1.01 (ref 1.010–1.025)
UROBILINOGEN UA: 0.2 U/dL

## 2018-10-11 MED ORDER — PROMETHAZINE HCL 25 MG PO TABS: 25.0000 mg | ORAL_TABLET | Freq: Three times a day (TID) | ORAL | 0 refills | Status: DC | PRN

## 2018-10-11 MED ORDER — ONDANSETRON 8 MG PO TBDP: 8.0000 mg | ORAL_TABLET | Freq: Three times a day (TID) | ORAL | 0 refills | Status: DC | PRN

## 2018-10-11 MED FILL — ONDANSETRON ODT 8 MG TABLET: 8 | 7 days supply | Qty: 20 | Fill #0

## 2018-10-11 MED FILL — PROMETHAZINE 25 MG TABLET: 25 | 7 days supply | Qty: 20 | Fill #0

## 2018-10-11 NOTE — Telephone Encounter (Signed)
FYI

## 2018-10-11 NOTE — Progress Notes (Addendum)
  Subjective:     Patient ID: Emily Craig, female   DOB: March 01, 1971, 47 y.o.   MRN: [id number]  HPI   Emily Craig is a 47 y.o. female here today with nausea/vomting/diarrhea. Symptoms started last night around 0200. She vomited several times until 8am and then it stopped. She has had 3 episodes of diarrhea and overall stomach cramping. She is not taking any medications for the symptoms and is concerned that she is dehydrated. Today she has been able to keep down sips of water and other fluids. She is burping however is not vomiting. She works for [employer] and she called the office today to find out if anyone else was sick. She was told that 15 other employees had the same symptoms. She informed me that the group had lunch-in on Wednesday where they all ate salad from [employer]. She says that a complaint has been filed.  She took one tylenol today which helped with the chills.   Review of Systems  Constitutional: Positive for appetite change, chills and fatigue. Negative for fever.  Gastrointestinal: Positive for abdominal pain, diarrhea, nausea and vomiting.   Objective:   Physical Exam  Constitutional: She is oriented to person, place, and time. She appears well-developed and well-nourished.  Non-toxic appearance. She has a sickly appearance. She does not appear ill. No distress.  HENT:  Head: Normocephalic.  Abdominal: Soft. She exhibits no distension and no mass. There is no tenderness. There is no rebound and no guarding. No hernia.  Neurological: She is alert and oriented to person, place, and time.  Skin: Skin is warm. There is pallor.  Psychiatric: She has a normal mood and affect. Her behavior is normal.   Results for orders placed or performed in visit on 10/11/18 (from the past 48 hour(s))  POCT urinalysis dipstick     Status: Normal   Collection Time: 10/11/18  5:02 PM  Result Value Ref Range   Color, UA yellow    Clarity, UA clear    Glucose, UA Negative Negative    Bilirubin, UA Negative    Ketones, UA Negative    Spec Grav, UA 1.010 1.010 - 1.025   Blood, UA Negative    pH, UA 7.0 5.0 - 8.0   Protein, UA Negative Negative   Urobilinogen, UA 0.2 0.2 or 1.0 E.U./dL   Nitrite, UA Negative    Leukocytes, UA Negative Negative   Appearance     Odor     Blood pressure 124/82, pulse (!) 104, temperature 98.7 F (37.1 C), temperature source Oral, resp. rate 18, weight 294 lb 6.4 oz (133.5 kg), SpO2 97 %.  Assessment:   1. Nausea and vomiting in adult   2. Diarrhea, infectious, adult   3. Abdominal cramping    Plan:   47 year old female here with N/V/D. Symptoms consistent with gastroenteritis vs. Food poisoning. She is aware that this is self limiting. Negative abdominal exam and symptoms have subsided other than nausea.  - Rx: Zofran, Phenergan  -Urine today does not show dehydration  - BRAT diet - increase fluids slowly. - If symptoms worsen go to [facility] or [facility] ED for labs and IV fluid and further workup.   Lezlie Lye, NP 10/11/2018 4:56 PM

## 2018-10-11 NOTE — Telephone Encounter (Signed)
Returned call to pt.  C/o nausea, vomiting, abdominal pain, and diarrhea, that started at 2:00 AM.  Rated her abdominal pain at 4/10.  Described that it feels like her guts are being squeezed.  C/o feeling hot and cold; does not have a thermometer.  Has not had anything to eat or drink today, except a few sips of water that she started  belching from.  Last episode of vomiting was at 7:45 AM.  Reported 3 small diarrhea stools today.  Denied any signs of blood in emesis or stool.  Stated she has dry mouth, and feels somewhat weak.  Reported she last urinated at about 8:00 AM today.  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  Reported a Drug Rep brought food in on Wed., and a ceasar salad was served; she ate salad on Wed., and on Thurs.  Voiced concern about Ecoli infection.  Advised she needs to be evaluated today; no available appts. at PCP office. Offered to look for appt. At another [place] office.  The pt. Stated she wanted to try and rehydrate, and if no improvement in symptoms this afternoon, she will go to UC.  Care advice given per protocol.  Advised against taking Motrin due to stomach upset.  Verb. Understanding.         Reason for Disposition . [1] Constant abdominal pain AND [2] present > 2 hours    Reported onset of nausea, vomiting and abdominal pain at 2:00 AM.  Stated she vomited about 5 times.  Described pain in abdomen feels like her "guts are being squeezed."  Denied any signs of bleeding in emesis or stool.  Answer Assessment - Initial Assessment Questions 1. VOMITING SEVERITY: "How many times have you vomited in the past 24 hours?"     - MILD:  1 - 2 times/day    - MODERATE: 3 - 5 times/day, decreased oral intake without significant weight loss or symptoms of dehydration    - SEVERE: 6 or more times/day, vomits everything or nearly everything, with significant weight loss, symptoms of dehydration      Vomiting onset about 2:00  AM 2.  ONSET: "When did the vomiting begin?"      2:00 AM  3. FLUIDS: "What fluids or food have you vomited up today?" "Have you been able to keep any fluids down?"     Last vomited at 7:45 AM; drank 1/2 bottle water just before bed; and 3/4 cup milk at supper last night   4. ABDOMINAL PAIN: "Are your having any abdominal pain?" If yes : "How bad is it and what does it feel like?" (e.g., crampy, dull, intermittent, constant)      Mid abdominal; across center; feels like guts are being squeezed;  5. DIARRHEA: "Is there any diarrhea?" If so, ask: "How many times today?"      Diarrhea stools x 3; small 6. CONTACTS: "Is there anyone else in the family with the same symptoms?"      Approx. 15 people in office have similar symptoms  7. CAUSE: "What do you think is causing your vomiting?"     Concerned about Ecoli infection; ate meal provided by a drug rep 8. HYDRATION STATUS: "Any signs of dehydration?" (e.g., dry mouth [not only dry lips], too weak to stand) "When did you last urinate?"    Dry mouth; some weakness, last urination at 8:00 AM  9. OTHER SYMPTOMS: "Do you have any other symptoms?" (e.g., fever,  headache, vertigo, vomiting blood or coffee grounds, recent head injury)    Headache, vomiting, feels hot and cold (no thermometer) 10. PREGNANCY: "Is there any chance you are pregnant?" "When was your last menstrual period?"       LMP one week ago  Protocols used: [facility]  Message from Sharene Skeans sent at 10/11/2018 12:05 PM EST   Summary: E coli infection inquiry    Pt was calling to been seen today due to stomach pain and vomiting.   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 Pt said they had a lunch and the 13 employees that are sick ate salad. Pt is concerned she has an E coli infection or something from eating the salad. She would like to be called back for advise/please advise

## 2018-10-11 NOTE — Addendum Note (Signed)
Addended by: Michaelle Copas F on: 10/11/2018 05:05 PM   Modules accepted: Orders

## 2018-10-25 ENCOUNTER — Other Ambulatory Visit: Payer: Self-pay | Admitting: Family Medicine

## 2018-10-25 DIAGNOSIS — Z1231 Encounter for screening mammogram for malignant neoplasm of breast: Secondary | ICD-10-CM

## 2018-11-11 ENCOUNTER — Encounter: Payer: Self-pay | Admitting: Family Medicine

## 2018-11-11 DIAGNOSIS — Z1231 Encounter for screening mammogram for malignant neoplasm of breast: Secondary | ICD-10-CM

## 2018-11-12 ENCOUNTER — Ambulatory Visit
Admission: RE | Admit: 2018-11-12 | Discharge: 2018-11-12 | Disposition: A | Discharge status: Home / Self Care | Payer: No Typology Code available for payment source | Source: Ambulatory Visit | Attending: Family Medicine | Admitting: Family Medicine

## 2018-11-12 DIAGNOSIS — Z1231 Encounter for screening mammogram for malignant neoplasm of breast: Secondary | ICD-10-CM

## 2018-11-12 IMAGING — MG DIGITAL SCREENING BILATERAL MAMMOGRAM WITH TOMO AND CAD
8 series · 8 of 24 positions shown · non-contrast
Comparison: Previous exam(s).

CLINICAL DATA: Screening.

EXAM:
DIGITAL SCREENING BILATERAL MAMMOGRAM WITH TOMO AND CAD

[R MLO synth-2D]
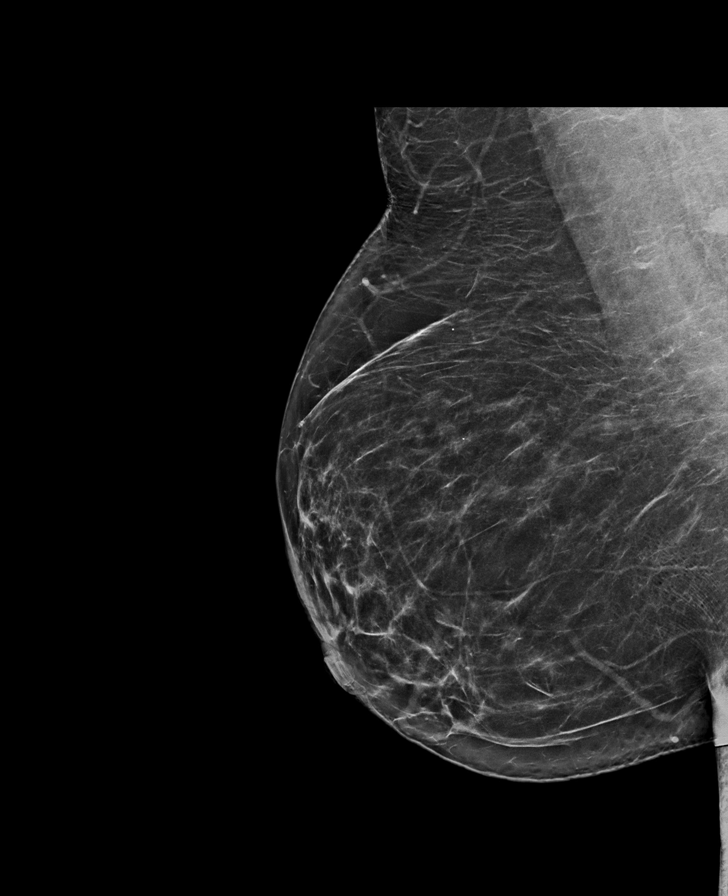

[L CC synth-2D]
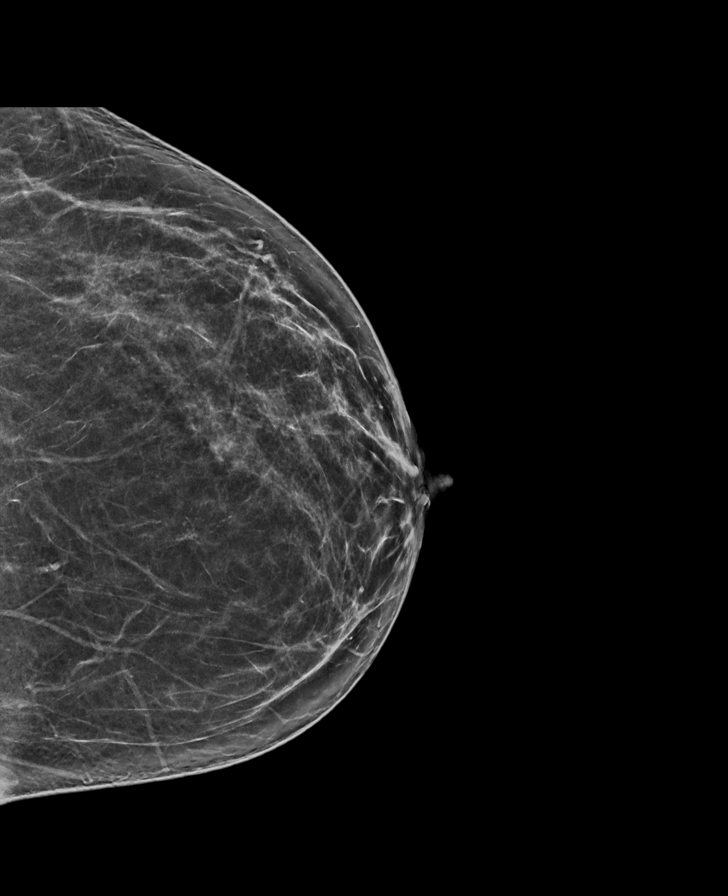

[L MLO synth-2D]
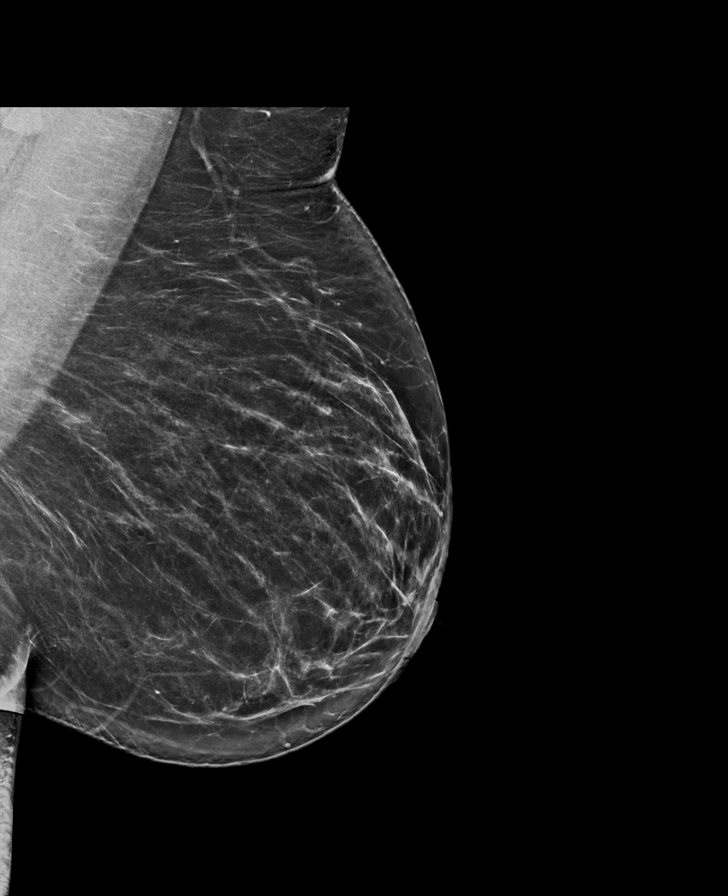

[R CC synth-2D]
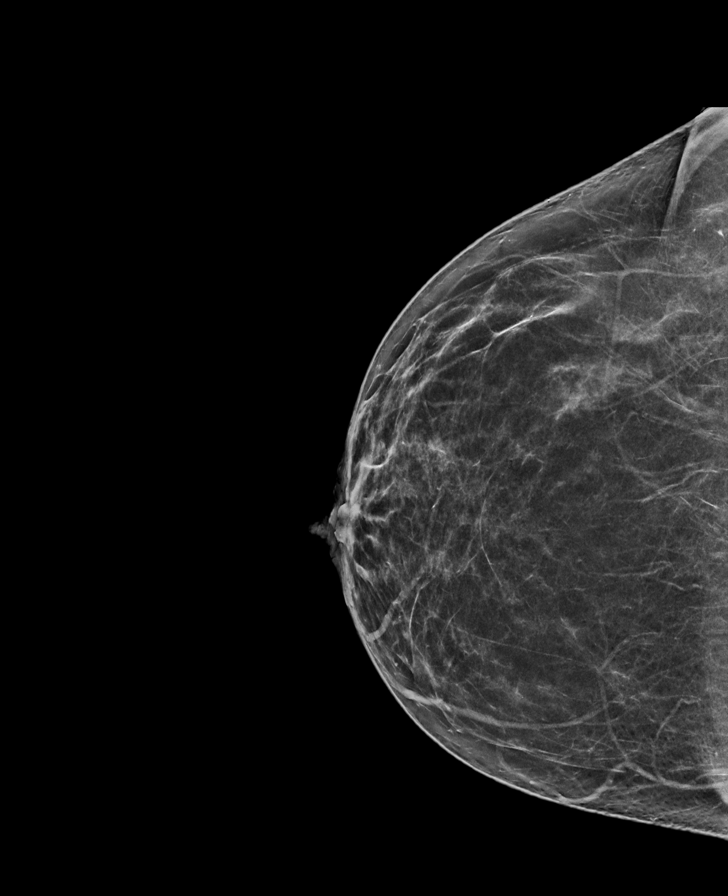

[R CC tomo · tomo slice 37/72.0]
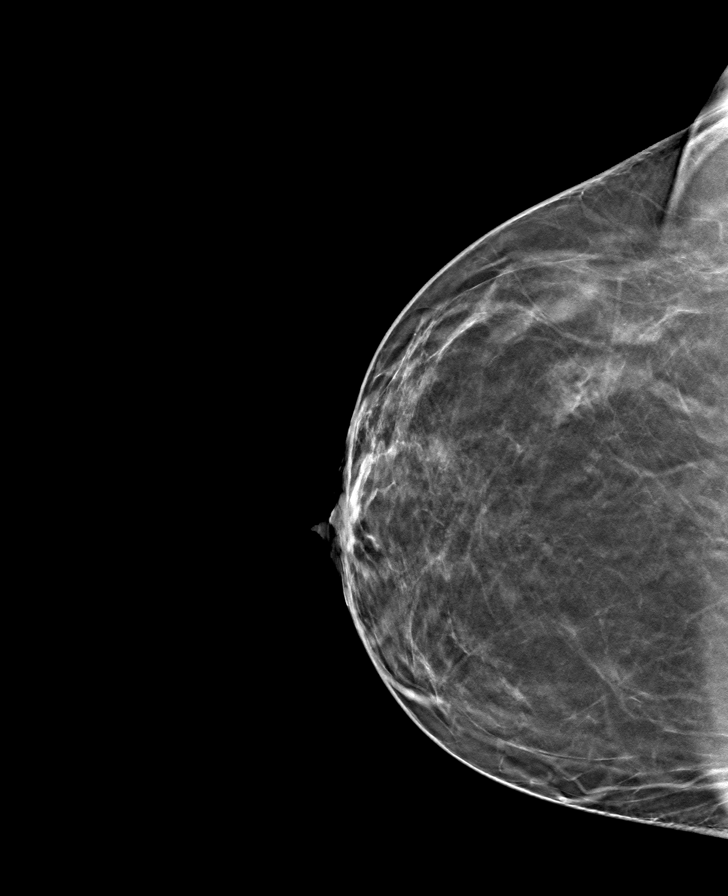

[R MLO tomo · tomo slice 41/81.0]
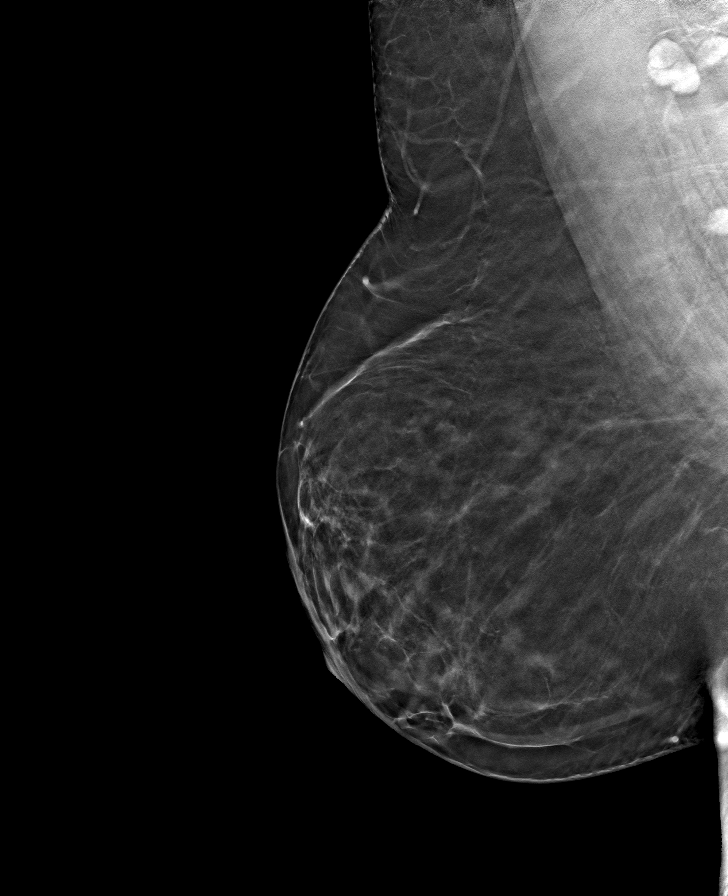

[L CC tomo · tomo slice 37/74.0]
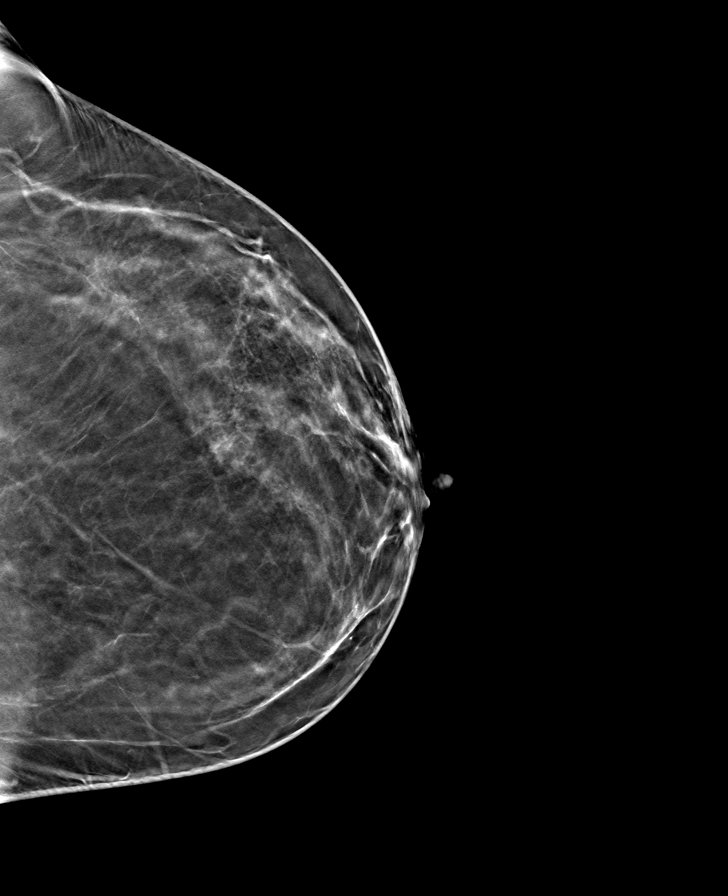

[L MLO tomo · tomo slice 43/86.0]
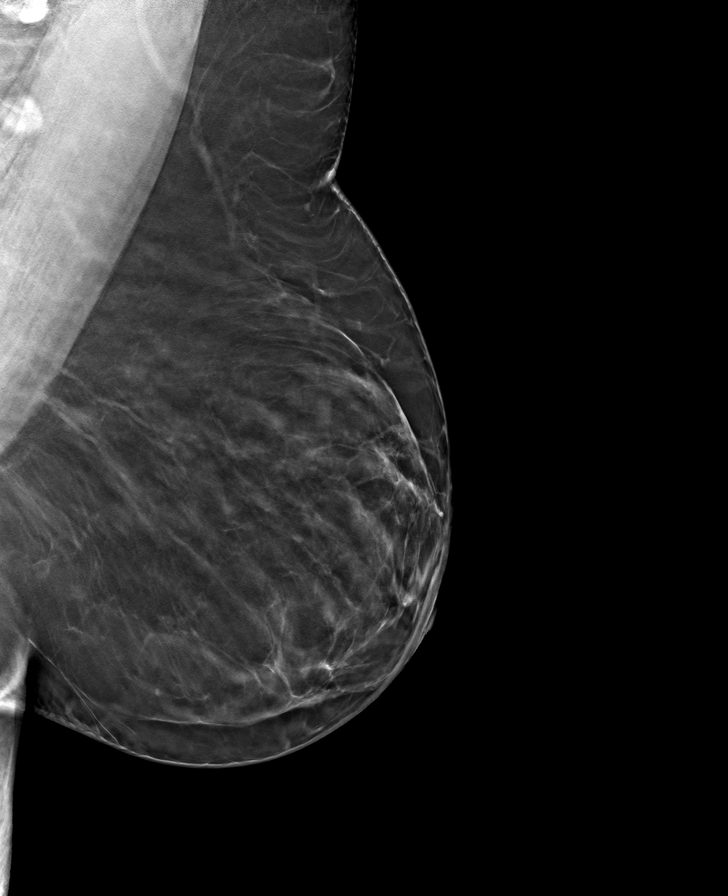

[8 of 24 positions shown; findings below may reference images not displayed]

ACR Breast Density Category b: There are scattered areas of
fibroglandular density.
FINDINGS: There are no findings suspicious for malignancy. Images were
processed with CAD.
IMPRESSION: No mammographic evidence of malignancy. A result letter of this
screening mammogram will be mailed directly to the patient.

RECOMMENDATION:
Screening mammogram in one year. (Code:[TQ])

BI-RADS CATEGORY  1: Negative.

## 2018-11-15 ENCOUNTER — Other Ambulatory Visit: Payer: Self-pay | Admitting: Family Medicine

## 2018-11-15 ENCOUNTER — Encounter: Payer: Self-pay | Admitting: Family Medicine

## 2018-12-02 ENCOUNTER — Encounter: Payer: Self-pay | Admitting: Family Medicine

## 2018-12-03 ENCOUNTER — Other Ambulatory Visit: Payer: No Typology Code available for payment source

## 2018-12-03 ENCOUNTER — Other Ambulatory Visit: Payer: Self-pay | Admitting: General Practice

## 2018-12-03 DIAGNOSIS — E039 Hypothyroidism, unspecified: Secondary | ICD-10-CM

## 2018-12-04 LAB — TSH: TSH: 2.22 u[IU]/mL (ref 0.450–4.500)

## 2018-12-12 MED FILL — SPIRONOLACTONE 50 MG TABLET: 50 | 90 days supply | Qty: 90 | Fill #1

## 2019-01-30 ENCOUNTER — Telehealth: Payer: Self-pay | Admitting: Family Medicine

## 2019-01-30 MED ORDER — LEVOTHYROXINE SODIUM 175 MCG PO TABS: 175.0000 ug | ORAL_TABLET | Freq: Every day | ORAL | 3 refills | Status: DC

## 2019-01-30 NOTE — Telephone Encounter (Signed)
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 She did state that she will need refills on the synthroid. Pt can [place]. Pt can be reached at the home #

## 2019-01-30 NOTE — Telephone Encounter (Signed)
Patient is scheduled for June, last TSH was normal.  Spoke with patient to let her know.

## 2019-02-05 ENCOUNTER — Encounter: Payer: 59 | Admitting: Family Medicine

## 2019-03-17 MED FILL — LEVOTHYROXINE 175 MCG TAB: 175 | 90 days supply | Qty: 90 | Fill #0

## 2019-03-20 MED FILL — SPIRONOLACTONE 50 MG TABS: 50 | 90 days supply | Qty: 90 | Fill #0

## 2019-04-30 ENCOUNTER — Encounter: Payer: Self-pay | Admitting: Family Medicine

## 2019-04-30 ENCOUNTER — Ambulatory Visit (INDEPENDENT_AMBULATORY_CARE_PROVIDER_SITE_OTHER): Payer: No Typology Code available for payment source | Admitting: Family Medicine

## 2019-04-30 ENCOUNTER — Other Ambulatory Visit: Payer: Self-pay

## 2019-04-30 VITALS — BP 131/83 | HR 67 | Temp 97.3°F | Resp 16 | Ht 66.0 in | Wt 297.4 lb

## 2019-04-30 DIAGNOSIS — Z1211 Encounter for screening for malignant neoplasm of colon: Secondary | ICD-10-CM

## 2019-04-30 DIAGNOSIS — H938X2 Other specified disorders of left ear: Secondary | ICD-10-CM | POA: Diagnosis not present

## 2019-04-30 DIAGNOSIS — Z0000 Encounter for general adult medical examination without abnormal findings: Secondary | ICD-10-CM | POA: Diagnosis not present

## 2019-04-30 LAB — CBC WITH DIFFERENTIAL/PLATELET
Basophils Absolute: 0.1 10*3/uL (ref 0.0–0.1)
Basophils Relative: 1.1 % (ref 0.0–3.0)
Eosinophils Absolute: 0.2 10*3/uL (ref 0.0–0.7)
Eosinophils Relative: 3.4 % (ref 0.0–5.0)
HCT: 37.7 % (ref 36.0–46.0)
Hemoglobin: 12.1 g/dL (ref 12.0–15.0)
Lymphocytes Relative: 34.2 % (ref 12.0–46.0)
Lymphs Abs: 2.4 10*3/uL (ref 0.7–4.0)
MCHC: 32 g/dL (ref 30.0–36.0)
MCV: 80.5 fl (ref 78.0–100.0)
Monocytes Absolute: 0.6 10*3/uL (ref 0.1–1.0)
Monocytes Relative: 8.9 % (ref 3.0–12.0)
Neutro Abs: 3.6 10*3/uL (ref 1.4–7.7)
Neutrophils Relative %: 52.4 % (ref 43.0–77.0)
Platelets: 314 10*3/uL (ref 150.0–400.0)
RBC: 4.69 Mil/uL (ref 3.87–5.11)
RDW: 16.6 % — ABNORMAL HIGH (ref 11.5–15.5)
WBC: 7 10*3/uL (ref 4.0–10.5)

## 2019-04-30 LAB — HEPATIC FUNCTION PANEL
ALT: 11 U/L (ref 0–35)
AST: 9 U/L (ref 0–37)
Albumin: 4.2 g/dL (ref 3.5–5.2)
Alkaline Phosphatase: 53 U/L (ref 39–117)
Bilirubin, Direct: 0.1 mg/dL (ref 0.0–0.3)
Total Bilirubin: 0.4 mg/dL (ref 0.2–1.2)
Total Protein: 7.1 g/dL (ref 6.0–8.3)

## 2019-04-30 LAB — BASIC METABOLIC PANEL
BUN: 10 mg/dL (ref 6–23)
CO2: 27 mEq/L (ref 19–32)
Calcium: 9.3 mg/dL (ref 8.4–10.5)
Chloride: 103 mEq/L (ref 96–112)
Creatinine, Ser: 0.85 mg/dL (ref 0.40–1.20)
GFR: 70.72 mL/min (ref >60.00)
Glucose, Bld: 90 mg/dL (ref 70–99)
Potassium: 4.6 mEq/L (ref 3.5–5.1)
Sodium: 138 mEq/L (ref 135–145)

## 2019-04-30 LAB — LIPID PANEL
Cholesterol: 208 mg/dL — ABNORMAL HIGH (ref 0–200)
HDL: 48.6 mg/dL (ref >39.00)
LDL Cholesterol: 139 mg/dL — ABNORMAL HIGH (ref 0–99)
NonHDL: 159.71
Total CHOL/HDL Ratio: 4
Triglycerides: 104 mg/dL (ref 0.0–149.0)
VLDL: 20.8 mg/dL (ref 0.0–40.0)

## 2019-04-30 LAB — VITAMIN D 25 HYDROXY (VIT D DEFICIENCY, FRACTURES): VITD: 33.69 ng/mL (ref 30.00–100.00)

## 2019-04-30 LAB — TSH: TSH: 4.12 u[IU]/mL (ref 0.35–4.50)

## 2019-04-30 NOTE — Assessment & Plan Note (Signed)
Pt's PE WNL w/ exception of obesity and L ear mass.  UTD on GYN, immunizations.  Refer for colonoscopy.  Check labs.  Anticipatory guidance provided.

## 2019-04-30 NOTE — Patient Instructions (Signed)
Follow up in 1 year or as needed We'll notify you of your lab results and make any changes if needed Continue to work on healthy diet and regular exercise- you can do it!! We'll call you with your ENT appt Call with any questions or concerns Hang in there! Stay Safe!!

## 2019-04-30 NOTE — Assessment & Plan Note (Signed)
Ongoing issue for pt.  Stressed need for healthy diet and regular exercise.  Check labs to risk stratify.  Will follow 

## 2019-04-30 NOTE — Progress Notes (Signed)
   Subjective:    Patient ID: Rodman Key, female    DOB: 1971-01-26, 48 y.o.   MRN: [id number]  HPI CPE- UTD on pap, mammo, immunizations.  Due for colonoscopy now that she's 48.  Pt is not exercising regularly.   Review of Systems Patient reports no vision changes, adenopathy, fever, weight change,  persistant/recurrent hoarseness , swallowing issues, chest pain, palpitations, edema, persistant/recurrent cough, hemoptysis, dyspnea (rest/exertional/paroxysmal nocturnal), gastrointestinal bleeding (melena, rectal bleeding), abdominal pain, significant heartburn, bowel changes, GU symptoms (dysuria, hematuria, incontinence), Gyn symptoms (abnormal  bleeding, pain),  syncope, focal weakness, memory loss, numbness & tingling, skin/hair/nail changes, abnormal bruising or bleeding, anxiety, or depression.   L sided hearing loss- started w/ a small bump ~3 weeks ago which has enlarged and interfered w/ hearing.  + bleeding, now scabbed over.  Now w/ serosanguinous drainage.     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, R ear WNL  Nose:   Nares normal, septum midline, mucosa normal, no drainage    or sinus tenderness  Throat:   Lips, mucosa, and tongue normal; teeth and gums normal  Neck:   Supple, symmetrical, trachea midline, no adenopathy;    Thyroid: no enlargement/tenderness/nodules  Back:     Symmetric, no curvature, ROM normal, no CVA tenderness  Lungs:     Clear to auscultation bilaterally, respirations unlabored  Chest Wall:    No tenderness or deformity   Heart:    Regular rate and rhythm, S1 and S2 normal, no murmur, rub   or gallop  Breast Exam:    Deferred to GYN  Abdomen:     Soft, non-tender,  bowel sounds active all four quadrants,    no masses, no organomegaly  Genitalia:    Deferred to GYN  Rectal:    Extremities:   Extremities normal, atraumatic, no cyanosis or edema  Pulses:   2+ and symmetric all extremities  Skin:   Skin color, texture, turgor normal, no rashes or lesions  Lymph nodes:   Cervical, supraclavicular, and axillary nodes normal  Neurologic:   CNII-XII intact, normal strength, sensation and reflexes    throughout          Assessment & Plan:  Soft tissue mass- new.  Consistent w/ pyogenic granloma (appearance and rapid enlargement) but unable to tell for certain.  Will refer to ENT for complete evaluation and tx.

## 2019-05-01 LAB — HEMOGLOBIN A1C: Hgb A1c MFr Bld: 5.9 % (ref 4.6–6.5)

## 2019-05-02 ENCOUNTER — Encounter: Payer: Self-pay | Admitting: Family Medicine

## 2019-07-01 ENCOUNTER — Encounter: Payer: Self-pay | Admitting: Family Medicine

## 2019-07-22 MED FILL — LEVOTHYROXINE 175 MCG TABLE: 175 | 30 days supply | Qty: 30 | Fill #0

## 2019-07-24 ENCOUNTER — Other Ambulatory Visit: Payer: Self-pay | Admitting: General Practice

## 2019-07-24 MED ORDER — SPIRONOLACTONE 50 MG PO TABS: 50.0000 mg | ORAL_TABLET | Freq: Every day | ORAL | 3 refills | Status: DC

## 2019-08-14 ENCOUNTER — Other Ambulatory Visit: Payer: Self-pay | Admitting: Internal Medicine

## 2019-08-14 MED FILL — LEVOTHYROXINE 175 MCG TABLE: 175 | 30 days supply | Qty: 30 | Fill #0

## 2019-08-14 MED FILL — SPIRONOLACTONE 50 MG TABLET: 50 | 90 days supply | Qty: 90 | Fill #0

## 2019-09-17 ENCOUNTER — Ambulatory Visit (INDEPENDENT_AMBULATORY_CARE_PROVIDER_SITE_OTHER): Payer: No Typology Code available for payment source | Admitting: Family Medicine

## 2019-09-17 ENCOUNTER — Encounter: Payer: Self-pay | Admitting: Family Medicine

## 2019-09-17 ENCOUNTER — Other Ambulatory Visit: Payer: Self-pay

## 2019-09-17 VITALS — BP 126/82 | HR 79 | Temp 97.8°F | Resp 16 | Ht 66.0 in | Wt 292.0 lb

## 2019-09-17 DIAGNOSIS — M654 Radial styloid tenosynovitis [de Quervain]: Secondary | ICD-10-CM | POA: Diagnosis not present

## 2019-09-17 MED ORDER — MELOXICAM 15 MG PO TABS: 15.0000 mg | ORAL_TABLET | Freq: Every day | ORAL | 0 refills | Status: DC

## 2019-09-17 MED FILL — MELOXICAM 15 MG TABLET: 15 | 30 days supply | Qty: 30 | Fill #0

## 2019-09-17 NOTE — Patient Instructions (Signed)
Follow up as needed or as scheduled START the once daily Meloxicam- take w/ food AVOID ibuprofen, aleve, etc while on Meloxicam but you can add tylenol for breakthrough pain Wear the splint as much as possible over the next 3-4 weeks (ideally 24 hrs/day but you can remove to ice, shower, etc) ICE If no improvement, let me know so we can refer to hand specialist Call with any questions or concerns Hang in there!

## 2019-09-17 NOTE — Progress Notes (Signed)
   Subjective:    Patient ID: Emily Craig, female    DOB: 1971-03-25, 48 y.o.   MRN: [id number]  HPI Thumb pain- bilateral, R>L.  Sxs started 'months' ago.  Pt reports stiffness and 'it's sensitive'.  TTP over [place].  Has intermittent numbness/tingling of first 3 fingers.  Has had constant pain R thumb x2 months.  Had been using Advil regularly but this caused stomach upset.  R hand dominant.  + weakness- unable to open things.  Also TTP at base of thumb   Review of Systems For ROS see HPI     Objective:   Physical Exam Vitals signs reviewed.  Constitutional:      Appearance: Normal appearance. She is obese.  HENT:     Head: Normocephalic and atraumatic.  Cardiovascular:     Pulses: Normal pulses.  Musculoskeletal:        General: Tenderness (TTP over R radial styloid, R CMC joint and R MCP joint) present. No deformity.  Skin:    General: Skin is warm and dry.  Neurological:     General: No focal deficit present.     Mental Status: She is alert and oriented to person, place, and time.     Cranial Nerves: No cranial nerve deficit.     Coordination: Coordination normal.           Assessment & Plan:  Emily Craig- new.  Pt reports she has had sxs for months but this has recently worsened to constant pain.  It is interfering w/ grip, strength, etc.  Start daily Mobic.  Thumb spica given.  Ice.  If no improvement will need referral to hand specialist.  Pt expressed understanding and is in agreement w/ plan.

## 2019-09-24 ENCOUNTER — Other Ambulatory Visit: Payer: Self-pay | Admitting: Family Medicine

## 2019-09-24 MED FILL — LEVOTHYROXINE 175 MCG TABLE: 175 | 90 days supply | Qty: 90 | Fill #0

## 2019-11-11 ENCOUNTER — Other Ambulatory Visit: Payer: Self-pay

## 2019-11-11 ENCOUNTER — Ambulatory Visit: Payer: No Typology Code available for payment source | Attending: Internal Medicine

## 2019-11-11 DIAGNOSIS — Z20822 Contact with and (suspected) exposure to covid-19: Secondary | ICD-10-CM

## 2019-11-12 LAB — NOVEL CORONAVIRUS, NAA: SARS-CoV-2, NAA: NOT DETECTED

## 2019-11-20 ENCOUNTER — Encounter: Payer: Self-pay | Admitting: Family Medicine

## 2019-11-20 DIAGNOSIS — T7840XA Allergy, unspecified, initial encounter: Secondary | ICD-10-CM

## 2019-11-20 MED ORDER — EPINEPHRINE 0.3 MG/0.3ML IJ SOAJ: 0.3000 mg | Freq: Once | INTRAMUSCULAR | 0 refills | Status: AC

## 2019-11-20 MED FILL — EPINEPHRINE 0.3 MG AUTO-INJ: 0.3 | 2 days supply | Qty: 2 | Fill #0

## 2020-01-12 ENCOUNTER — Encounter: Payer: Self-pay | Admitting: Family Medicine

## 2020-01-12 DIAGNOSIS — M79641 Pain in right hand: Secondary | ICD-10-CM

## 2020-01-12 DIAGNOSIS — M25531 Pain in right wrist: Secondary | ICD-10-CM

## 2020-01-15 ENCOUNTER — Encounter: Payer: Self-pay | Admitting: Family Medicine

## 2020-01-15 DIAGNOSIS — M25531 Pain in right wrist: Secondary | ICD-10-CM

## 2020-01-15 DIAGNOSIS — M79641 Pain in right hand: Secondary | ICD-10-CM

## 2020-01-20 ENCOUNTER — Ambulatory Visit: Payer: No Typology Code available for payment source | Admitting: Family Medicine

## 2020-02-17 ENCOUNTER — Other Ambulatory Visit: Payer: Self-pay | Admitting: Family Medicine

## 2020-02-17 MED FILL — SPIRONOLACTONE 50 MG TABLET: 50 | 90 days supply | Qty: 90 | Fill #1

## 2020-02-17 MED FILL — LEVOTHYROXINE 175 MCG TABLE: 175 | 90 days supply | Qty: 90 | Fill #0

## 2020-04-23 ENCOUNTER — Other Ambulatory Visit: Payer: Self-pay | Admitting: Family Medicine

## 2020-04-23 DIAGNOSIS — Z1231 Encounter for screening mammogram for malignant neoplasm of breast: Secondary | ICD-10-CM

## 2020-05-04 ENCOUNTER — Ambulatory Visit: Payer: No Typology Code available for payment source

## 2020-05-07 ENCOUNTER — Ambulatory Visit
Admission: RE | Admit: 2020-05-07 | Discharge: 2020-05-07 | Disposition: A | Discharge status: Home / Self Care | Payer: No Typology Code available for payment source | Source: Ambulatory Visit | Attending: Family Medicine | Admitting: Family Medicine

## 2020-05-07 ENCOUNTER — Other Ambulatory Visit: Payer: Self-pay

## 2020-05-07 DIAGNOSIS — Z1231 Encounter for screening mammogram for malignant neoplasm of breast: Secondary | ICD-10-CM

## 2020-05-07 IMAGING — MG DIGITAL SCREENING BILAT W/ TOMO W/ CAD
8 series · 8 of 24 positions shown · non-contrast
Comparison: Previous exam(s).

CLINICAL DATA: Screening.

EXAM:
DIGITAL SCREENING BILATERAL MAMMOGRAM WITH TOMO AND CAD

[R MLO synth-2D]
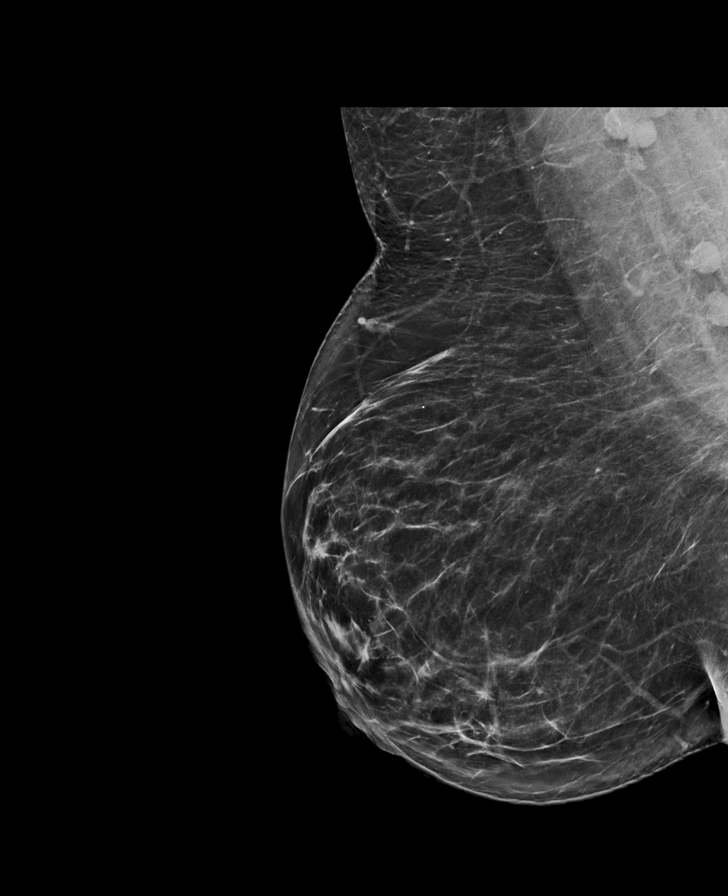

[R CC synth-2D]
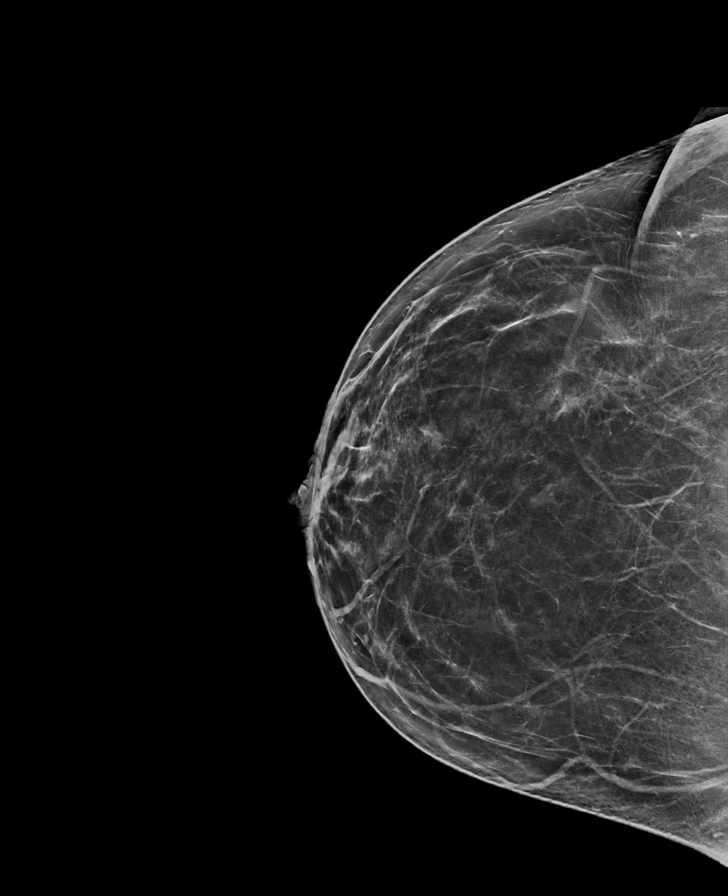

[L CC synth-2D]
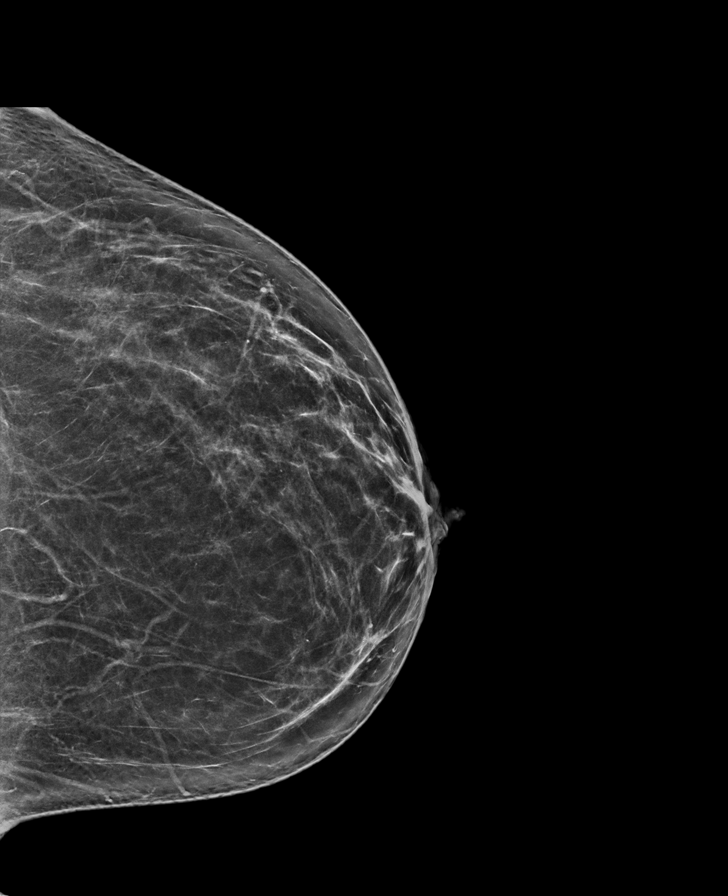

[L MLO synth-2D]
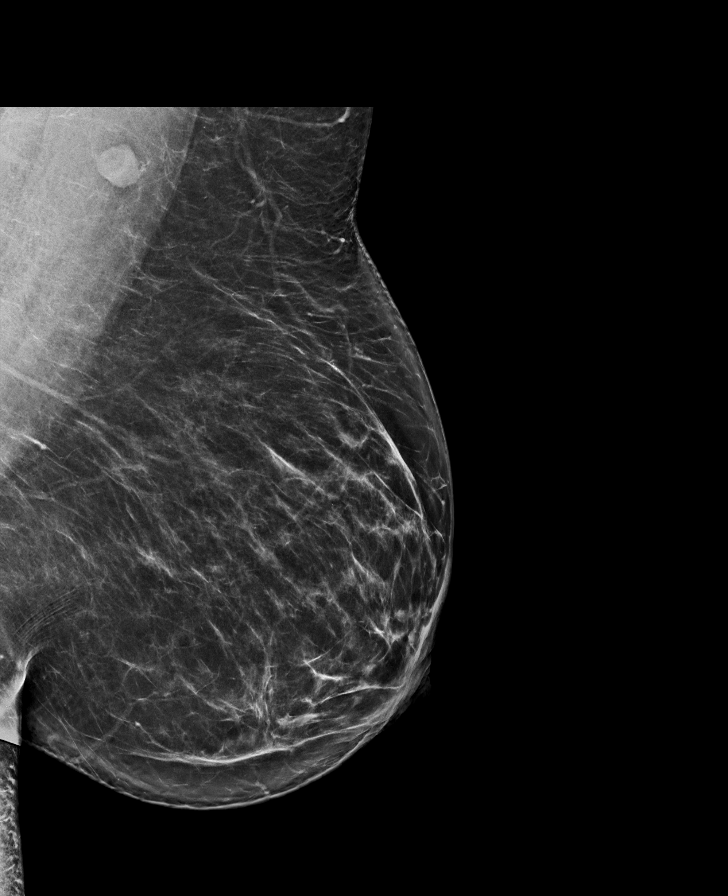

[R CC tomo · tomo slice 37/74.0]
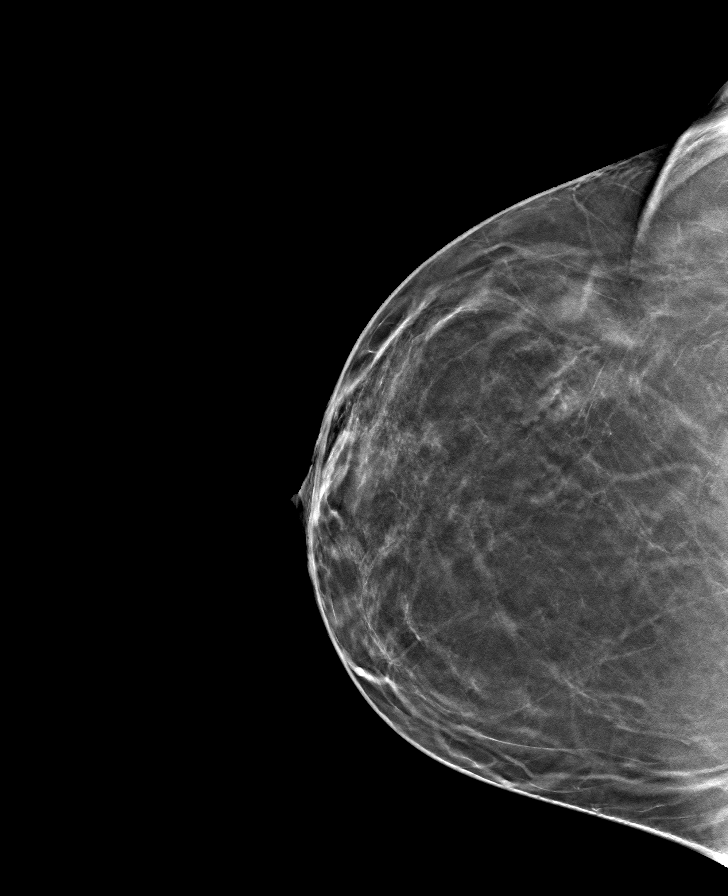

[L MLO tomo · tomo slice 44/87.0]
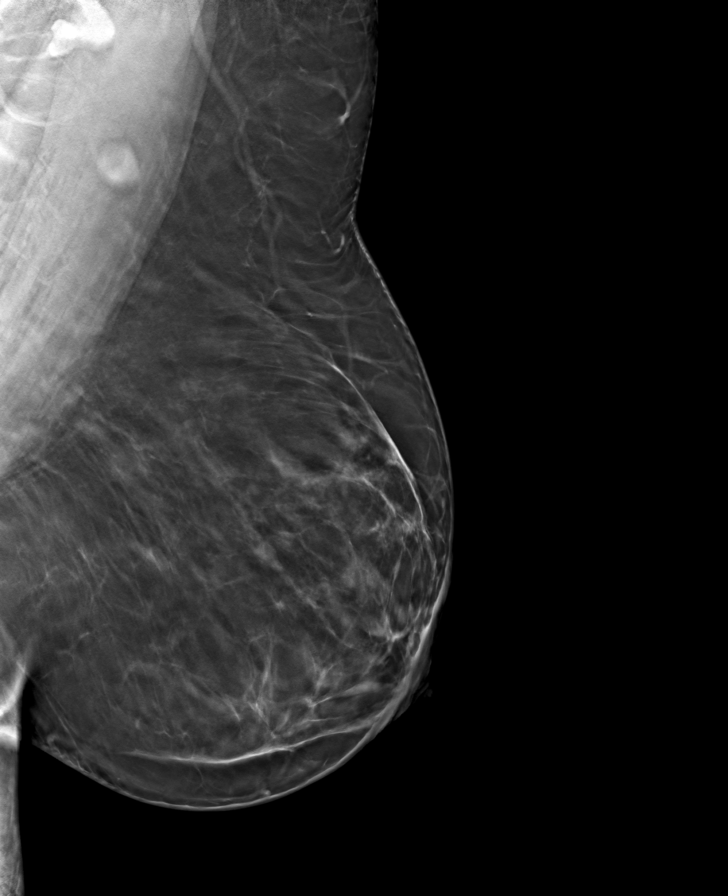

[R MLO tomo · tomo slice 43/84.0]
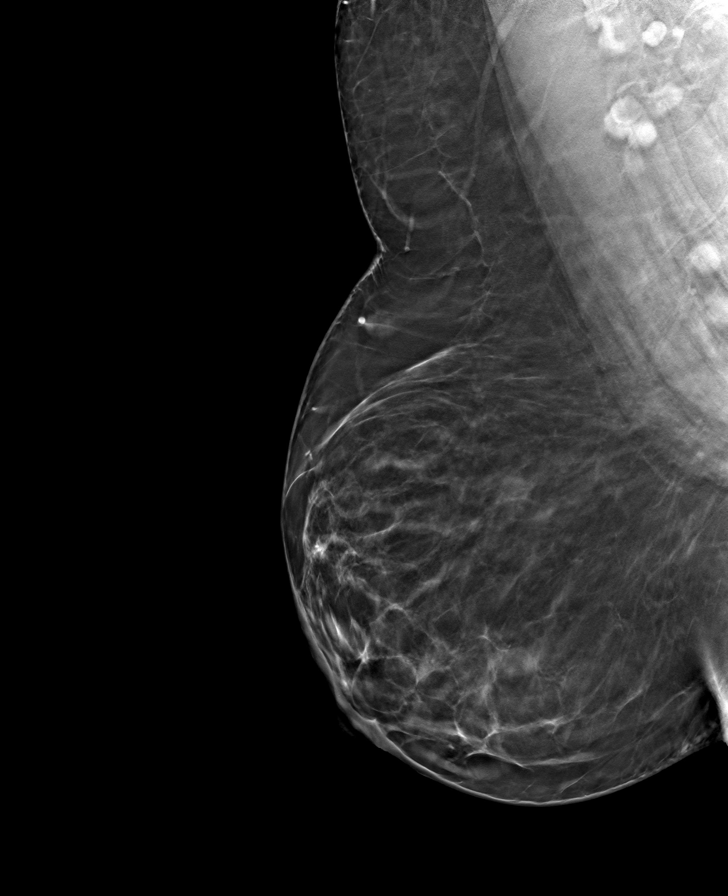

[L CC tomo · tomo slice 39/76.0]
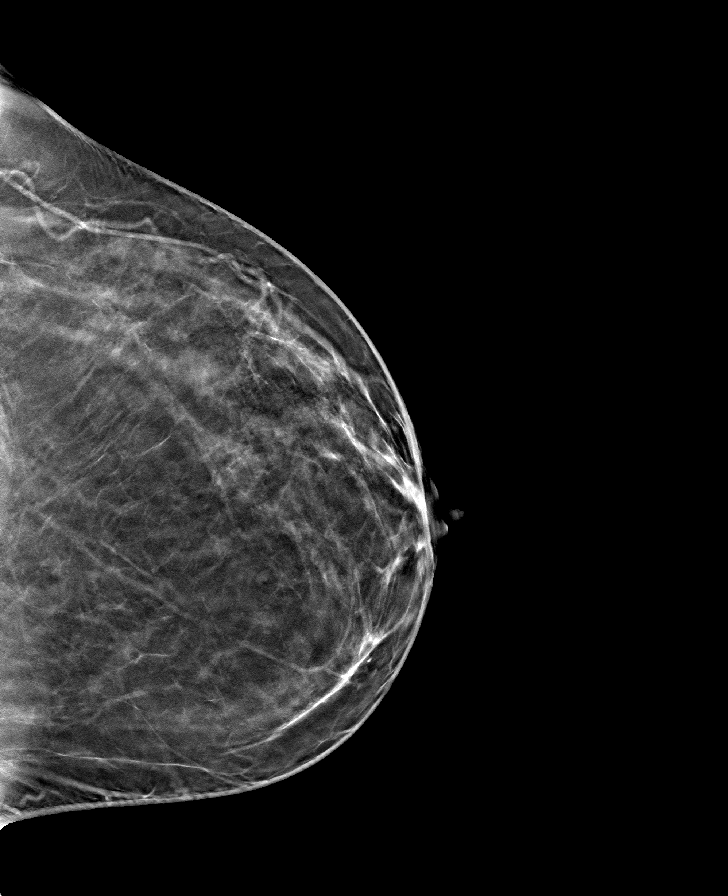

[8 of 24 positions shown; findings below may reference images not displayed]

ACR Breast Density Category b: There are scattered areas of
fibroglandular density.
FINDINGS: There are no findings suspicious for malignancy. Images were
processed with CAD.
IMPRESSION: No mammographic evidence of malignancy. A result letter of this
screening mammogram will be mailed directly to the patient.

RECOMMENDATION:
Screening mammogram in one year. (Code:[TQ])

BI-RADS CATEGORY  1: Negative.

## 2020-06-29 ENCOUNTER — Encounter: Payer: Self-pay | Admitting: Family Medicine

## 2020-06-29 MED ORDER — SPIRONOLACTONE 50 MG PO TABS
50.0000 mg | ORAL_TABLET | Freq: Every day | ORAL | 0 refills | Status: DC
Start: 2020-06-29 — End: 2020-08-06

## 2020-06-29 MED ORDER — LEVOTHYROXINE SODIUM 175 MCG PO TABS: 175.0000 ug | ORAL_TABLET | Freq: Every day | ORAL | 0 refills | Status: DC

## 2020-06-29 MED FILL — LEVOTHYROXINE 175 MCG TABLE: 175 | 30 days supply | Qty: 30 | Fill #0

## 2020-06-29 MED FILL — SPIRONOLACTONE 50 MG TABS: 50 | 30 days supply | Qty: 30 | Fill #0

## 2020-07-28 ENCOUNTER — Other Ambulatory Visit: Payer: Self-pay

## 2020-07-28 ENCOUNTER — Encounter: Payer: Self-pay | Admitting: Family Medicine

## 2020-07-28 ENCOUNTER — Ambulatory Visit (INDEPENDENT_AMBULATORY_CARE_PROVIDER_SITE_OTHER): Payer: No Typology Code available for payment source | Admitting: Family Medicine

## 2020-07-28 VITALS — BP 121/84 | HR 80 | Temp 97.8°F | Resp 16 | Ht 66.0 in | Wt 265.4 lb

## 2020-07-28 DIAGNOSIS — E559 Vitamin D deficiency, unspecified: Secondary | ICD-10-CM

## 2020-07-28 DIAGNOSIS — Z1211 Encounter for screening for malignant neoplasm of colon: Secondary | ICD-10-CM

## 2020-07-28 DIAGNOSIS — Z0000 Encounter for general adult medical examination without abnormal findings: Secondary | ICD-10-CM | POA: Diagnosis not present

## 2020-07-28 LAB — CBC WITH DIFFERENTIAL/PLATELET
Basophils Absolute: 0 10*3/uL (ref 0.0–0.1)
Basophils Relative: 0.6 % (ref 0.0–3.0)
Eosinophils Absolute: 0.2 10*3/uL (ref 0.0–0.7)
Eosinophils Relative: 2.8 % (ref 0.0–5.0)
HCT: 36.2 % (ref 36.0–46.0)
Hemoglobin: 11.7 g/dL — ABNORMAL LOW (ref 12.0–15.0)
Lymphocytes Relative: 33.2 % (ref 12.0–46.0)
Lymphs Abs: 1.9 10*3/uL (ref 0.7–4.0)
MCHC: 32.2 g/dL (ref 30.0–36.0)
MCV: 80.2 fl (ref 78.0–100.0)
Monocytes Absolute: 0.5 10*3/uL (ref 0.1–1.0)
Monocytes Relative: 9.4 % (ref 3.0–12.0)
Neutro Abs: 3.1 10*3/uL (ref 1.4–7.7)
Neutrophils Relative %: 54 % (ref 43.0–77.0)
Platelets: 250 10*3/uL (ref 150.0–400.0)
RBC: 4.52 Mil/uL (ref 3.87–5.11)
RDW: 16.1 % — ABNORMAL HIGH (ref 11.5–15.5)
WBC: 5.8 10*3/uL (ref 4.0–10.5)

## 2020-07-28 LAB — VITAMIN D 25 HYDROXY (VIT D DEFICIENCY, FRACTURES): VITD: 39.02 ng/mL (ref 30.00–100.00)

## 2020-07-28 LAB — HEPATIC FUNCTION PANEL
ALT: 13 U/L (ref 0–35)
AST: 10 U/L (ref 0–37)
Albumin: 4.2 g/dL (ref 3.5–5.2)
Alkaline Phosphatase: 55 U/L (ref 39–117)
Bilirubin, Direct: 0.1 mg/dL (ref 0.0–0.3)
Total Bilirubin: 0.5 mg/dL (ref 0.2–1.2)
Total Protein: 7 g/dL (ref 6.0–8.3)

## 2020-07-28 LAB — BASIC METABOLIC PANEL
BUN: 20 mg/dL (ref 6–23)
CO2: 27 mEq/L (ref 19–32)
Calcium: 9.4 mg/dL (ref 8.4–10.5)
Chloride: 101 mEq/L (ref 96–112)
Creatinine, Ser: 0.85 mg/dL (ref 0.40–1.20)
GFR: 70.36 mL/min (ref >60.00)
Glucose, Bld: 83 mg/dL (ref 70–99)
Potassium: 4.3 mEq/L (ref 3.5–5.1)
Sodium: 138 mEq/L (ref 135–145)

## 2020-07-28 LAB — LIPID PANEL
Cholesterol: 157 mg/dL (ref 0–200)
HDL: 43.1 mg/dL (ref >39.00)
LDL Cholesterol: 99 mg/dL (ref 0–99)
NonHDL: 114.33
Total CHOL/HDL Ratio: 4
Triglycerides: 77 mg/dL (ref 0.0–149.0)
VLDL: 15.4 mg/dL (ref 0.0–40.0)

## 2020-07-28 LAB — TSH: TSH: 1.12 u[IU]/mL (ref 0.35–4.50)

## 2020-07-28 NOTE — Assessment & Plan Note (Signed)
Much improved- pt is down 30 lbs!  Applauded her efforts and encouraged her to continue.  Will check labs to risk stratify.

## 2020-07-28 NOTE — Progress Notes (Signed)
   Subjective:    Patient ID: Emily Craig, female    DOB: 05/07/1971, 49 y.o.   MRN: [id number]  HPI CPE- UTD on pap, mammo, Tdap, COVID immunizations.  Will get flu shot at work.  Due for colonoscopy.  Pt is down 30 lbs using Optivia  Reviewed past medical, surgical, family and social histories.   Health Maintenance  Topic Date Due  . COVID-19 Vaccine (1) Never done  . COLONOSCOPY  Never done  . INFLUENZA VACCINE  06/06/2020  . Hepatitis C Screening  07/28/2021 (Originally 28-Nov-1970)  . HIV Screening  07/28/2021 (Originally 01/19/1984)  . MAMMOGRAM  05/07/2021  . PAP SMEAR-Modifier  11/06/2021  . TETANUS/TDAP  08/18/2022      Review of Systems Patient reports no vision/ hearing changes, adenopathy,fever, persistant/recurrent hoarseness , swallowing issues, chest pain, palpitations, edema, persistant/recurrent cough, hemoptysis, dyspnea (rest/exertional/paroxysmal nocturnal), gastrointestinal bleeding (melena, rectal bleeding), abdominal pain, significant heartburn, bowel changes, GU symptoms (dysuria, hematuria, incontinence), Gyn symptoms (abnormal  bleeding, pain),  syncope, focal weakness, memory loss, numbness & tingling, skin/hair/nail changes, abnormal bruising or bleeding, anxiety, or depression.   This visit occurred during the SARS-CoV-2 public health emergency.  Safety protocols were in place, including screening questions prior to the visit, additional usage of staff PPE, and extensive cleaning of exam room while observing appropriate contact time as indicated for disinfecting solutions.       Objective:   Physical Exam General Appearance:    Alert, cooperative, no distress, appears stated age, obese  Head:    Normocephalic, without obvious abnormality, atraumatic  Eyes:    PERRL, conjunctiva/corneas clear, EOM's intact, fundi    benign, both eyes  Ears:    Normal TM's and external ear canals, both ears  Nose:   Deferred due to COVID  Throat:   Neck:   Supple,  symmetrical, trachea midline, no adenopathy;    Thyroid: no enlargement/tenderness/nodules  Back:     Symmetric, no curvature, ROM normal, no CVA tenderness  Lungs:     Clear to auscultation bilaterally, respirations unlabored  Chest Wall:    No tenderness or deformity   Heart:    Regular rate and rhythm, S1 and S2 normal, no murmur, rub   or gallop  Breast Exam:    Deferred to GYN  Abdomen:     Soft, non-tender, bowel sounds active all four quadrants,    no masses, no organomegaly  Genitalia:    Deferred to GYN  Rectal:    Extremities:   Extremities normal, atraumatic, no cyanosis or edema  Pulses:   2+ and symmetric all extremities  Skin:   Skin color, texture, turgor normal, no rashes or lesions  Lymph nodes:   Cervical, supraclavicular, and axillary nodes normal  Neurologic:   CNII-XII intact, normal strength, sensation and reflexes    throughout         Assessment & Plan:

## 2020-07-28 NOTE — Patient Instructions (Addendum)
Follow up in 1 year or as needed We'll notify you of your lab results and make any changes if needed Keep up the good work on healthy diet and regular exercise- you're doing great!! We'll call you with your GI appt for the colonoscopy consultation When you get your flu shot please message [place] so we can update your chart Call with any questions or concerns Stay Safe!  Stay Healthy!

## 2020-07-28 NOTE — Assessment & Plan Note (Signed)
Pt's PE WNL w/ exception of obesity.  UTD on pap, mammo, Tdap, COVID.  Due for flu- plans to get at work.  GI referral placed for colonoscopy.  Check labs.  Anticipatory guidance provided.

## 2020-07-28 NOTE — Assessment & Plan Note (Signed)
Pt has hx of this.  Check labs and replete prn. 

## 2020-08-06 ENCOUNTER — Other Ambulatory Visit: Payer: Self-pay | Admitting: Family Medicine

## 2020-08-06 ENCOUNTER — Encounter: Payer: Self-pay | Admitting: Family Medicine

## 2020-08-06 MED ORDER — SPIRONOLACTONE 50 MG PO TABS
50.0000 mg | ORAL_TABLET | Freq: Every day | ORAL | 1 refills | Status: DC
Start: 2020-08-06 — End: 2020-08-06

## 2020-08-06 MED ORDER — LEVOTHYROXINE SODIUM 175 MCG PO TABS: 175.0000 ug | ORAL_TABLET | Freq: Every day | ORAL | 1 refills | Status: DC

## 2020-08-06 MED FILL — SPIRONOLACTONE 50 MG TABS: 50 | 90 days supply | Qty: 90 | Fill #0

## 2020-08-06 MED FILL — LEVOTHYROXINE 175 MCG TABLE: 175 | 90 days supply | Qty: 90 | Fill #0

## 2020-09-10 ENCOUNTER — Encounter: Payer: Self-pay | Admitting: Gastroenterology

## 2020-09-21 ENCOUNTER — Other Ambulatory Visit: Payer: Self-pay | Admitting: Gastroenterology

## 2020-09-21 ENCOUNTER — Other Ambulatory Visit: Payer: Self-pay

## 2020-09-21 ENCOUNTER — Ambulatory Visit (AMBULATORY_SURGERY_CENTER): Payer: Self-pay | Admitting: *Deleted

## 2020-09-21 VITALS — Ht 66.0 in | Wt 253.0 lb

## 2020-09-21 DIAGNOSIS — Z1211 Encounter for screening for malignant neoplasm of colon: Secondary | ICD-10-CM

## 2020-09-21 MED ORDER — SUPREP BOWEL PREP KIT 17.5-3.13-1.6 GM/177ML PO SOLN: 1.0000 | Freq: Once | ORAL | 0 refills | Status: DC

## 2020-09-21 MED FILL — SUPREP BOWEL PREP KIT: 17.5-3.13-1 | 1 days supply | Qty: 354 | Fill #0

## 2020-09-21 NOTE — Progress Notes (Signed)
Completed covid vaccines greater than 2 weeks ago  Pt's previsit is done over the phone and all paperwork (prep instructions, blank consent form to just read over, pre-procedure acknowledgement form and stamped envelope) sent to patient  Pt is aware that care partner will wait in the car during procedure; if they feel like they will be too hot or cold to wait in the car; they may wait in the 4 th floor lobby. Patient is aware to bring only one care partner. We want them to wear a mask (we do not have any that we can provide them), practice social distancing, and we will check their temperatures when they get here.  I did remind the patient that their care partner needs to stay in the parking lot the entire time and have a cell phone available, we will call them when the pt is ready for discharge. Patient will wear mask into building.    No egg or soy allergy  No home oxygen use   No medications for weight loss taken  emmi information given  Pt denies constipation issues  No trouble with anesthesia, difficulty with intubation or hx/fam hx of malignant hyperthermia per pt

## 2020-09-22 ENCOUNTER — Encounter: Payer: Self-pay | Admitting: Gastroenterology

## 2020-09-29 ENCOUNTER — Encounter: Payer: Self-pay | Admitting: Family Medicine

## 2020-10-04 ENCOUNTER — Other Ambulatory Visit: Payer: Self-pay

## 2020-10-04 DIAGNOSIS — M25559 Pain in unspecified hip: Secondary | ICD-10-CM

## 2020-10-12 ENCOUNTER — Ambulatory Visit (AMBULATORY_SURGERY_CENTER): Payer: No Typology Code available for payment source | Admitting: Gastroenterology

## 2020-10-12 ENCOUNTER — Other Ambulatory Visit: Payer: Self-pay

## 2020-10-12 ENCOUNTER — Encounter: Payer: Self-pay | Admitting: Gastroenterology

## 2020-10-12 VITALS — BP 129/76 | HR 47 | Temp 97.1°F | Resp 13 | Ht 66.0 in | Wt 253.0 lb

## 2020-10-12 DIAGNOSIS — D120 Benign neoplasm of cecum: Secondary | ICD-10-CM

## 2020-10-12 DIAGNOSIS — Z1211 Encounter for screening for malignant neoplasm of colon: Secondary | ICD-10-CM | POA: Diagnosis present

## 2020-10-12 MED ORDER — SODIUM CHLORIDE 0.9 % IV SOLN: 500.0000 mL | Freq: Once | INTRAVENOUS | Status: DC

## 2020-10-12 NOTE — Progress Notes (Signed)
Report given to PACU, vss 

## 2020-10-12 NOTE — Progress Notes (Signed)
VS by [place]. 

## 2020-10-12 NOTE — Op Note (Signed)
[facility] Patient Name: Emily Craig Procedure Date: 10/12/2020 1:32 PM MRN: [id number] Endoscopist: Milus Banister , MD Age: 49 Referring MD:  Date of Birth: [birth year] Gender: Female Account #: [id number] Procedure:                Colonoscopy Indications:              Screening for colorectal malignant neoplasm Medicines:                Monitored Anesthesia Care Procedure:                Pre-Anesthesia Assessment:                           - Prior to the procedure, a History and Physical                            was performed, and patient medications and                            allergies were reviewed. The patient's tolerance of                            previous anesthesia was also reviewed. The risks                            and benefits of the procedure and the sedation                            options and risks were discussed with the patient.                            All questions were answered, and informed consent                            was obtained. Prior Anticoagulants: The patient has                            taken no previous anticoagulant or antiplatelet                            agents. ASA Grade Assessment: III - A patient with                            severe systemic disease. After reviewing the risks                            and benefits, the patient was deemed in                            satisfactory condition to undergo the procedure.                           After obtaining informed consent, the colonoscope  was passed under direct vision. Throughout the                            procedure, the patient's blood pressure, pulse, and                            oxygen saturations were monitored continuously. The                            Colonoscope was introduced through the anus and                            advanced to the the cecum, identified by                            appendiceal orifice  and ileocecal valve. The                            colonoscopy was performed without difficulty. The                            patient tolerated the procedure well. The quality                            of the bowel preparation was good. The ileocecal                            valve, appendiceal orifice, and rectum were                            photographed. Scope In: 1:34:55 PM Scope Out: 1:52:31 PM Scope Withdrawal Time: 0 hours 11 minutes 34 seconds  Total Procedure Duration: 0 hours 17 minutes 36 seconds  Findings:                 A 3 mm polyp was found in the cecum. The polyp was                            sessile. The polyp was removed with a cold snare.                            Resection and retrieval were complete.                           A few diverticula were found in the left colon.                           The exam was otherwise without abnormality on                            direct and retroflexion views. Complications:            No immediate complications. Estimated blood loss:  None. Estimated Blood Loss:     Estimated blood loss: none. Impression:               - One 3 mm polyp in the cecum, removed with a cold                            snare. Resected and retrieved.                           - Diverticulosis in the left colon.                           - The examination was otherwise normal on direct                            and retroflexion views. Recommendation:           - Patient has a contact number available for                            emergencies. The signs and symptoms of potential                            delayed complications were discussed with the                            patient. Return to normal activities tomorrow.                            Written discharge instructions were provided to the                            patient.                           - Resume previous diet.                           -  Continue present medications.                           - Await pathology results. Milus Banister, MD 10/12/2020 1:55:19 PM This report has been signed electronically.

## 2020-10-12 NOTE — Patient Instructions (Signed)
1 polyp- please read over handout about polyps  Please read over handouts about diverticulosis and high fiber diets  Await pathology  Continue your normal medications  YOU HAD AN ENDOSCOPIC PROCEDURE TODAY AT [facility]:   Refer to the procedure report that was given to you for any specific questions about what was found during the examination.  If the procedure report does not answer your questions, please call your gastroenterologist to clarify.  If you requested that your care partner not be given the details of your procedure findings, then the procedure report has been included in a sealed envelope for you to review at your convenience later.  YOU SHOULD EXPECT: Some feelings of bloating in the abdomen. Passage of more gas than usual.  Walking can help get rid of the air that was put into your GI tract during the procedure and reduce the bloating. If you had a lower endoscopy (such as a colonoscopy or flexible sigmoidoscopy) you may notice spotting of blood in your stool or on the toilet paper. If you underwent a bowel prep for your procedure, you may not have a normal bowel movement for a few days.  Please Note:  You might notice some irritation and congestion in your nose or some drainage.  This is from the oxygen used during your procedure.  There is no need for concern and it should clear up in a day or so.  SYMPTOMS TO REPORT IMMEDIATELY:   Following lower endoscopy (colonoscopy or flexible sigmoidoscopy):  Excessive amounts of blood in the stool  Significant tenderness or worsening of abdominal pains  Swelling of the abdomen that is new, acute  Fever of 100F or higher  For urgent or emergent issues, a gastroenterologist can be reached at any hour by calling [phone]. Do not use MyChart messaging for urgent concerns.    DIET:  We do recommend a small meal at first, but then you may proceed to your regular diet.  Drink plenty of fluids but you should avoid  alcoholic beverages for 24 hours.  ACTIVITY:  You should plan to take it easy for the rest of today and you should NOT DRIVE or use heavy machinery until tomorrow (because of the sedation medicines used during the test).    FOLLOW UP: Our staff will call the number listed on your records 48-72 hours following your procedure to check on you and address any questions or concerns that you may have regarding the information given to you following your procedure. If we do not reach you, we will leave a message.  We will attempt to reach you two times.  During this call, we will ask if you have developed any symptoms of COVID 19. If you develop any symptoms (ie: fever, flu-like symptoms, shortness of breath, cough etc.) before then, please call [phone].  If you test positive for Covid 19 in the 2 weeks post procedure, please call and report this information to [place].    If any biopsies were taken you will be contacted by phone or by letter within the next 1-3 weeks.  Please call us at [phone] if you have not heard about the biopsies in 3 weeks.    SIGNATURES/CONFIDENTIALITY: You and/or your care partner have signed paperwork which will be entered into your electronic medical record.  These signatures attest to the fact that that the information above on your After Visit Summary has been reviewed and is understood.  Full responsibility of the confidentiality of  this discharge information lies with you and/or your care-partner.

## 2020-10-12 NOTE — Progress Notes (Signed)
Called to room to assist during endoscopic procedure.  Patient ID and intended procedure confirmed with present staff. Received instructions for my participation in the procedure from the performing physician.  

## 2020-10-14 ENCOUNTER — Telehealth: Payer: Self-pay | Admitting: *Deleted

## 2020-10-14 NOTE — Telephone Encounter (Signed)
  Follow up Call-  Call back number 10/12/2020  Post procedure Call Back phone  # [phone]  Permission to leave phone message Yes  Some recent data might be hidden     Patient questions:  Do you have a fever, pain , or abdominal swelling? No. Pain Score  0 *  Have you tolerated food without any problems? Yes.    Have you been able to return to your normal activities? Yes.    Do you have any questions about your discharge instructions: Diet   No. Medications  No. Follow up visit  No.  Do you have questions or concerns about your Care? No.  Actions: * If pain score is 4 or above: No action needed, pain <4.  1. Have you developed a fever since your procedure? NO   2.   Have you had an respiratory symptoms (SOB or cough) since your procedure? NO  3.   Have you tested positive for COVID 19 since your procedure NO  4.   Have you had any family members/close contacts diagnosed with the COVID 19 since your procedure?  NO   If yes to any of these questions please route to Joylene John, RN and Joella Prince, RN

## 2020-10-18 ENCOUNTER — Encounter: Payer: Self-pay | Admitting: Gastroenterology

## 2020-10-27 ENCOUNTER — Ambulatory Visit (INDEPENDENT_AMBULATORY_CARE_PROVIDER_SITE_OTHER): Payer: No Typology Code available for payment source

## 2020-10-27 ENCOUNTER — Ambulatory Visit (INDEPENDENT_AMBULATORY_CARE_PROVIDER_SITE_OTHER): Payer: No Typology Code available for payment source | Admitting: Family Medicine

## 2020-10-27 ENCOUNTER — Encounter: Payer: Self-pay | Admitting: Family Medicine

## 2020-10-27 ENCOUNTER — Other Ambulatory Visit: Payer: Self-pay

## 2020-10-27 ENCOUNTER — Ambulatory Visit: Payer: Self-pay

## 2020-10-27 ENCOUNTER — Other Ambulatory Visit: Payer: Self-pay | Admitting: Family Medicine

## 2020-10-27 VITALS — BP 120/90 | HR 74 | Ht 66.0 in | Wt 249.0 lb

## 2020-10-27 DIAGNOSIS — M25552 Pain in left hip: Secondary | ICD-10-CM

## 2020-10-27 DIAGNOSIS — M5410 Radiculopathy, site unspecified: Secondary | ICD-10-CM

## 2020-10-27 IMAGING — DX DG PELVIS 1-2V
1 series · 1 of 1 positions shown · non-contrast
Comparison: None.

CLINICAL DATA: Left hip pain for several weeks, no known injury,
initial encounter

EXAM:
PELVIS - 1 VIEW

[pelvis ap]
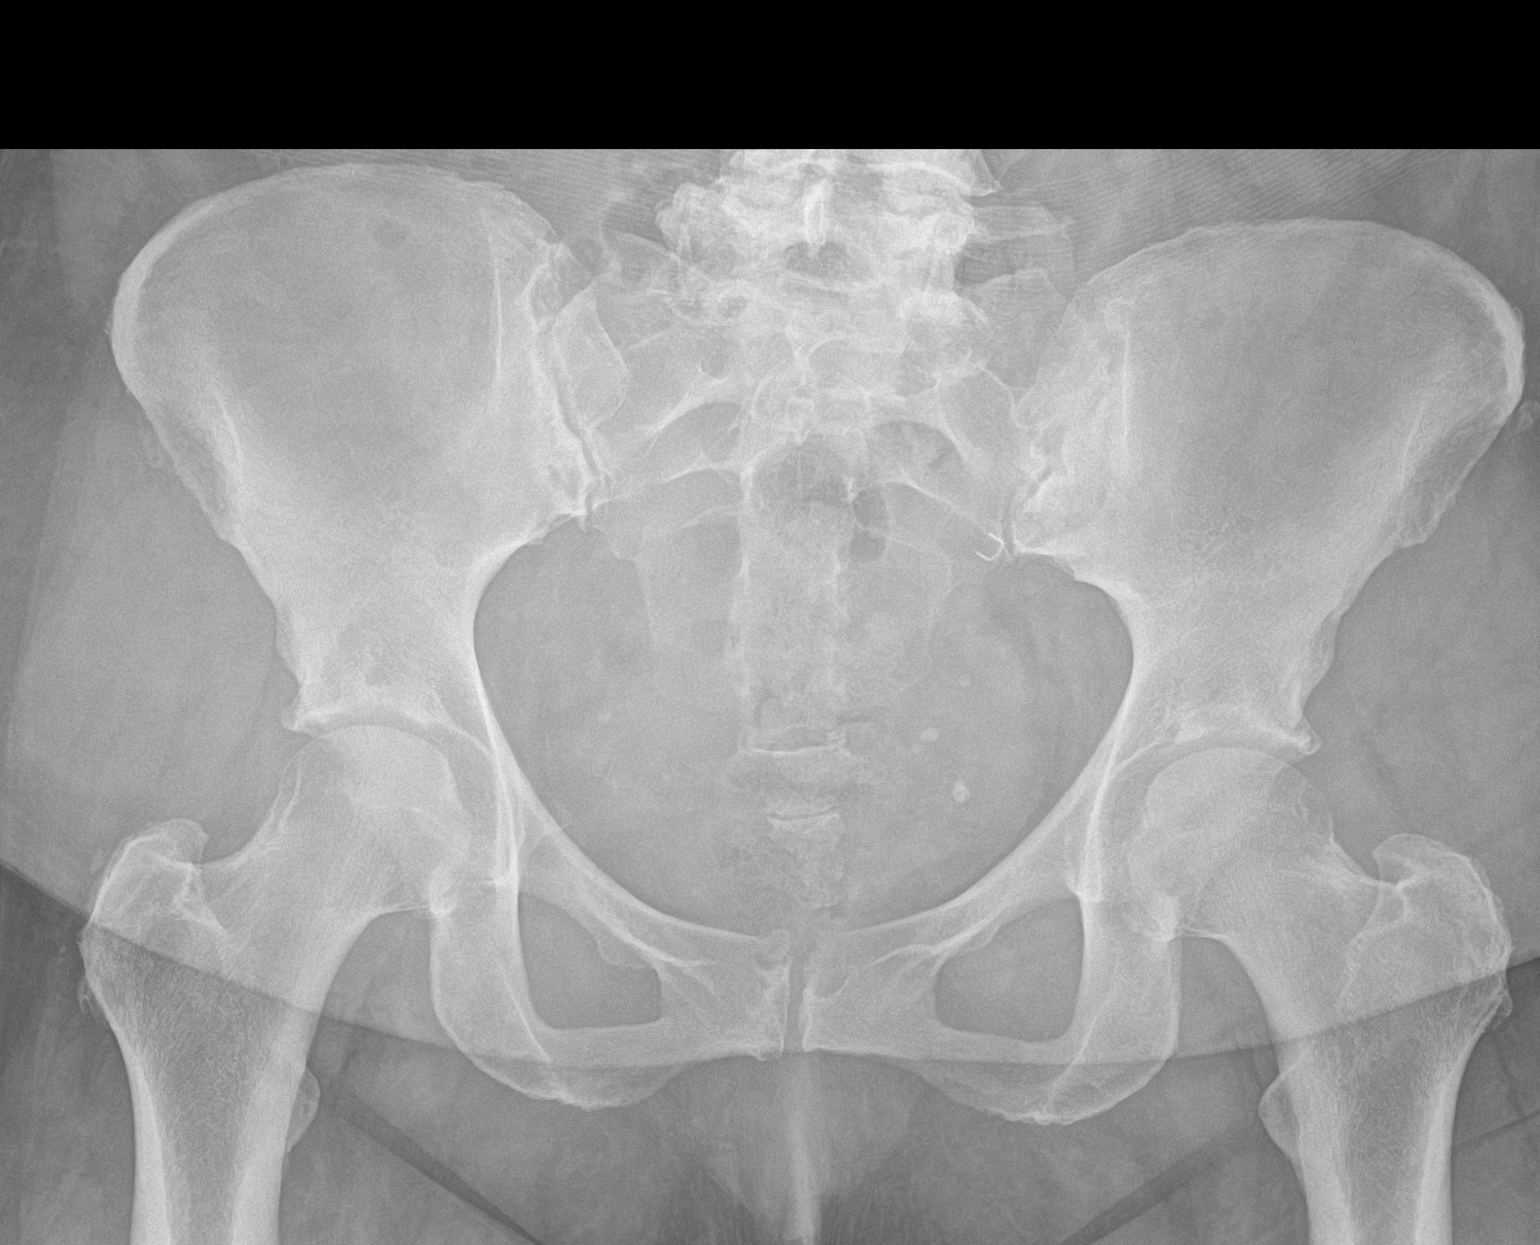

[1 of 1 positions shown; findings below may reference images not displayed]

FINDINGS: Mild degenerative changes of the hip joints are noted bilaterally.
No acute fracture or dislocation is noted. No soft tissue
abnormality is seen.
IMPRESSION: Mild degenerative change without acute abnormality

## 2020-10-27 IMAGING — DX DG LUMBAR SPINE COMPLETE 4+V
5 series · 5 of 5 positions shown · non-contrast
Comparison: [DATE]

CLINICAL DATA: Low back pain for several weeks

EXAM:
LUMBAR SPINE - COMPLETE 4+ VIEW

[l-spine ap]
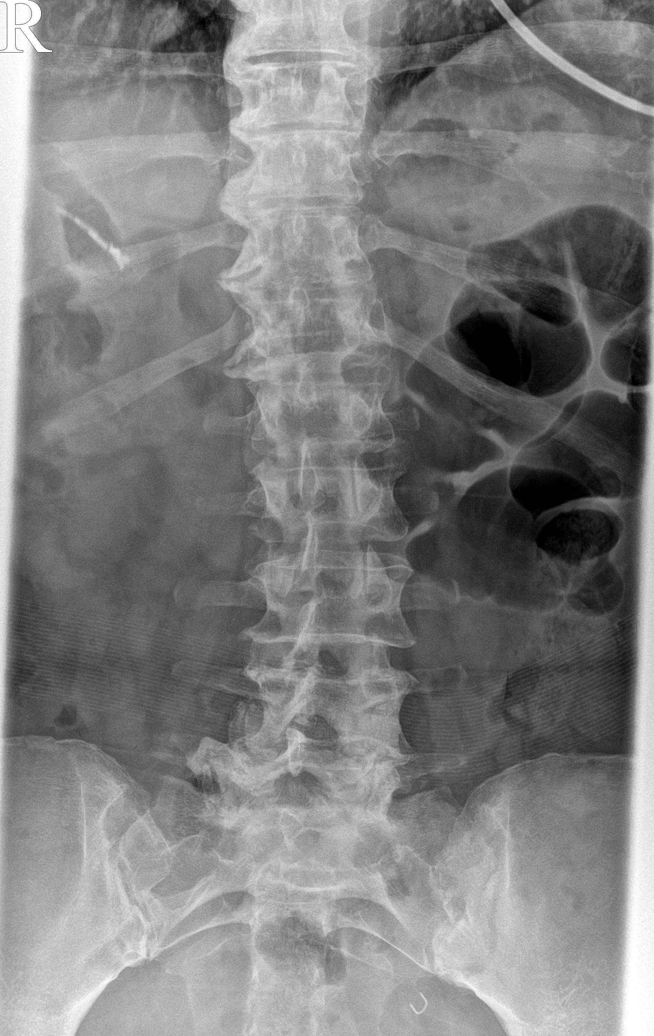

[l-spine obl (1 of 2)]
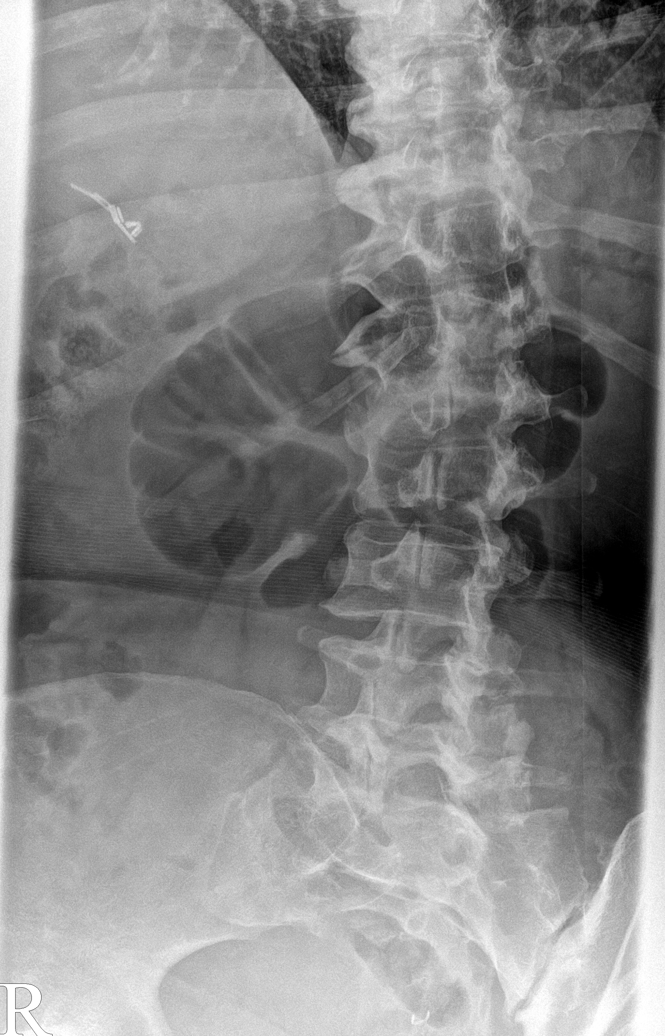

[l-spine obl (2 of 2)]
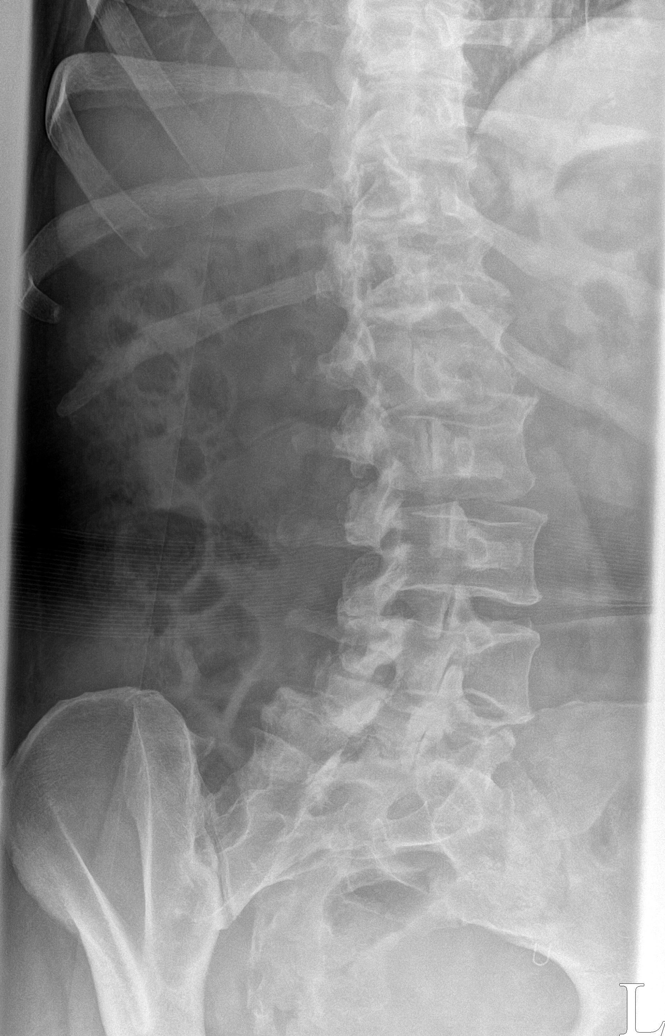

[l-spine lateral]
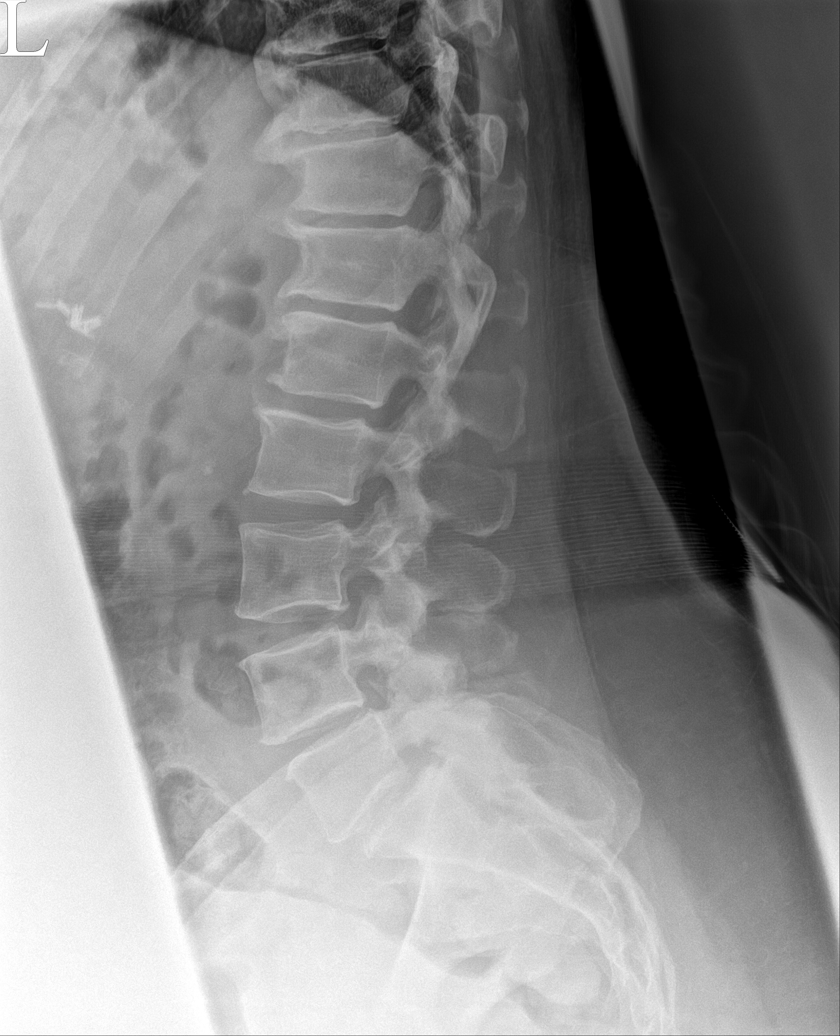

[l-spine spot]
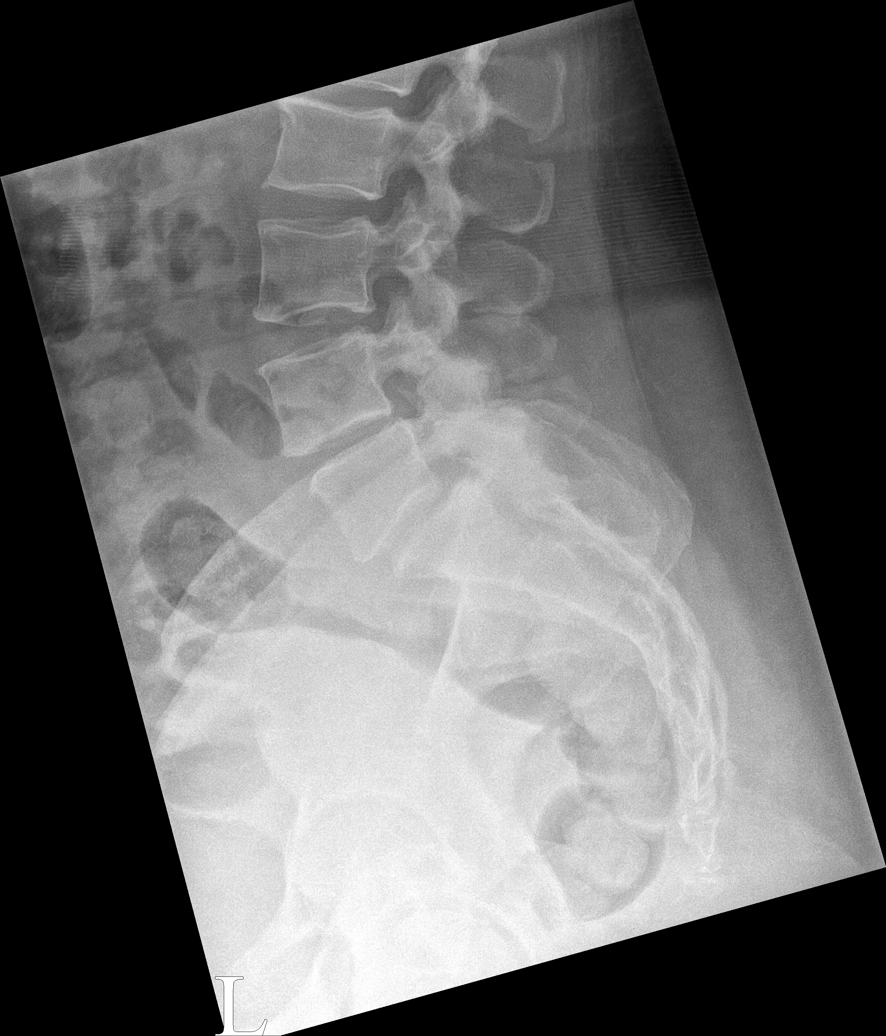

[5 of 5 positions shown; findings below may reference images not displayed]

FINDINGS: Five lumbar type vertebral bodies are well visualized. Vertebral
body height is well maintained. Mild facet hypertrophic changes are
noted. No pars defects are seen. Anterolisthesis of L5 on S1 is
noted stable from the prior exam. Disc space narrowing at L4-5 is
noted as well.
IMPRESSION: Stable degenerative change and anterolisthesis at L5-S1.

## 2020-10-27 MED ORDER — GABAPENTIN 100 MG PO CAPS: 200.0000 mg | ORAL_CAPSULE | Freq: Every day | ORAL | 3 refills | Status: DC

## 2020-10-27 MED ORDER — PREDNISONE 20 MG PO TABS: 40.0000 mg | ORAL_TABLET | Freq: Every day | ORAL | 0 refills | Status: DC

## 2020-10-27 MED FILL — GABAPENTIN 100 MG CAPSULE: 100 | 90 days supply | Qty: 180 | Fill #0

## 2020-10-27 MED FILL — predniSONE 20 MG TABS: 20 | 5 days supply | Qty: 10 | Fill #0

## 2020-10-27 NOTE — Progress Notes (Signed)
[facility] [address] [place] Phone: [phone] Subjective:   I [name] am serving as a Education administrator for Dr. Hulan Saas.  This visit occurred during the SARS-CoV-2 public health emergency.  Safety protocols were in place, including screening questions prior to the visit, additional usage of staff PPE, and extensive cleaning of exam room while observing appropriate contact time as indicated for disinfecting solutions.   I'm seeing this patient by the request  of:  Midge Minium, MD  CC: Left leg and hip pain  [id number]  Elexis Siedlecki is a 49 y.o. female coming in with complaint of left hip pain. Pain radiates down the leg, glut and groin. States she has a cramping feeling in the bottom of her right foot. Patient states the hip gets stiff. Pain keeps her up at night. States she is also having lower back pain. Any movement involving the lower back she feels the pain. States the back is tight and pulls to the left. Pain mostly at the hip.   Onset- 6 weeks  Location - left leg/hip Duration-  Character- achy  Aggravating factors- walking, standing, laying on her left side, stairs, getting in the car  Reliving factors-  Therapies tried- Advil, cool, heat, bath soaks Severity- 5/10 at its worse   Previous xray independently visualized and interpreted by me from 2018. 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 Arthritis    hands  . Hypertension   . Thyroid disease    Past Surgical History:  Procedure Laterality Date  . CHOLECYSTECTOMY    . TONSILLECTOMY AND ADENOIDECTOMY     Social History   Socioeconomic History  . Marital status: Married    Spouse name: [name]  . Number of children: 3  . Years of education: Therapist, sports  . Highest education level: Not on file  Occupational History  . 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 Smoking status: Never Smoker  . Smokeless tobacco: Never Used  Vaping Use  . Vaping Use: Never used  Substance and Sexual Activity  . Alcohol use: Yes    Alcohol/week: 0.0 standard drinks    Comment: rarely  . Drug use: No  . Sexual activity: Not on file  Other Topics Concern  . Not on file  Social History Narrative   Lives with her husband and their daughter.  Their sons (twins) stayed in [place] when they moved here June 2014.   Social Determinants of Health   Financial Resource Strain: Not on file  Food Insecurity: Not on file  Transportation Needs: Not on file  Physical Activity: Not on file  Stress: Not on file  Social Connections: Not on file   Allergies  Allergen Reactions  . Maxzide [Triamterene-Hctz] Swelling  . Penicillins     Allergy since childhood/unknown reaction  . Shrimp Extract Allergy Skin Test     Eats shrimp all the time per pt  . Sulfa Antibiotics Rash   Family History  Problem Relation Age of Onset  . Hypertension Mother   . Aortic aneurysm Mother   . Cancer Mother        pancreatic cancer  . Pancreatic cancer Mother   . Heart disease Father   . Hyperlipidemia Brother   . Hypertension Brother   . Diabetes Maternal Grandmother   . Heart disease Maternal Grandmother   .  Cancer Maternal Grandmother        reproductive   . Liver disease Maternal Grandfather   . Stroke Paternal Grandmother   . Breast cancer Cousin 50       paternal side   . Colon cancer Neg Hx   . Esophageal cancer Neg Hx   . Stomach cancer Neg Hx   . Rectal cancer Neg Hx     Current Outpatient Medications (Endocrine & Metabolic):  .  levothyroxine (SYNTHROID) 175 MCG tablet, Take 1 tablet (175 mcg total) by mouth daily before breakfast. .  predniSONE (DELTASONE) 20 MG tablet, Take 2 tablets (40 mg total) by mouth daily with breakfast.  Current Outpatient Medications (Cardiovascular):  .  spironolactone (ALDACTONE) 50 MG tablet, Take 1 tablet (50 mg total) by  mouth daily.  Current Outpatient Medications (Respiratory):  .  cetirizine (ZYRTEC) 10 MG tablet, Take 10 mg by mouth daily.    Current Outpatient Medications (Other):  .  omeprazole (PRILOSEC) 20 MG capsule, Take 20 mg by mouth daily. Marland Kitchen  gabapentin (NEURONTIN) 100 MG capsule, Take 2 capsules (200 mg total) by mouth at bedtime.   Reviewed prior external information including notes and imaging from  primary care provider As well as notes that were available from care everywhere and other healthcare systems.  Past medical history, social, surgical and family history all reviewed in electronic medical record.  No pertanent information unless stated regarding to the chief complaint.   Review of Systems:  No headache, visual changes, nausea, vomiting, diarrhea, constipation, dizziness, abdominal pain, skin rash, fevers, chills, night sweats, weight loss, swollen lymph nodes, body aches, joint swelling, chest pain, shortness of breath, mood changes. POSITIVE muscle aches  Objective  Blood pressure 120/90, pulse 74, height 5\' 6"  (1.676 m), weight 249 lb (112.9 kg), last menstrual period 10/10/2020, SpO2 98 %.   General: No apparent distress alert and oriented x3 mood and affect normal, dressed appropriately.  HEENT: Pupils equal, extraocular movements intact  Respiratory: Patient's speak in full sentences and does not appear short of breath  Cardiovascular: No lower extremity edema, non tender, no erythema  MSK: Patient is back exam does have some tenderness to palpation over the sacroiliac joint and mildly over the left side of the paraspinal musculature.  Significant tightness with straight leg test on the left compared to right.  Patient denies any worsening of the radicular symptoms.  Patient now does have good range of motion of the left hip with internal and external.  Patient very minimally tender over the greater trochanteric area more tenderness noted over the piriformis.  Deep tendon  reflexes is intact.  [id number]; 15 additional minutes spent for Therapeutic exercises as stated in above notes.  This included exercises focusing on stretching, strengthening, with significant focus on eccentric aspects.   Long term goals include an improvement in range of motion, strength, endurance as well as avoiding reinjury. Patient's frequency would include in 1-2 times a day, 3-5 times a week for a duration of 6-12 weeks. Low back exercises that included:  Pelvic tilt/bracing instruction to focus on control of the pelvic girdle and lower abdominal muscles  Glute strengthening exercises, focusing on proper firing of the glutes without engaging the low back muscles Proper stretching techniques for maximum relief for the hamstrings, hip flexors, low back and some rotation where tolerated   Proper technique shown and discussed handout in great detail with ATC.  All questions were discussed and answered.     Impression and Recommendations:  The above documentation has been reviewed and is accurate and complete Lyndal Pulley, DO

## 2020-10-27 NOTE — Patient Instructions (Addendum)
Good to see you Lumbar and pelvis xray Prednisone 40 mg daily 5 days  Gabapentin 200 mg Exercises for the back [place] See me again in 3-4 weeks if not better we will consider MRI or labs

## 2020-10-27 NOTE — Assessment & Plan Note (Signed)
Patient is having worsening radicular symptoms.  Had some significantly previously 3 years ago.  At this point I do feel that repeat x-rays will be beneficial.  Prednisone and gabapentin given.  Discussed icing regimen.  Encourage patient to continue with the weight loss.  Worsening symptoms advanced imaging or formal physical therapy will be necessary.

## 2020-10-27 NOTE — Assessment & Plan Note (Signed)
Patient is making progress

## 2020-11-18 ENCOUNTER — Encounter: Payer: Self-pay | Admitting: Family Medicine

## 2020-11-18 ENCOUNTER — Other Ambulatory Visit: Payer: Self-pay

## 2020-11-18 ENCOUNTER — Ambulatory Visit (INDEPENDENT_AMBULATORY_CARE_PROVIDER_SITE_OTHER): Payer: No Typology Code available for payment source | Admitting: Family Medicine

## 2020-11-18 VITALS — BP 120/82 | HR 72 | Ht 66.0 in | Wt 243.0 lb

## 2020-11-18 DIAGNOSIS — G5702 Lesion of sciatic nerve, left lower limb: Secondary | ICD-10-CM

## 2020-11-18 DIAGNOSIS — M5410 Radiculopathy, site unspecified: Secondary | ICD-10-CM | POA: Diagnosis not present

## 2020-11-18 MED FILL — LEVOTHYROXINE 175 MCG TABLE: 175 | 90 days supply | Qty: 90 | Fill #1

## 2020-11-18 MED FILL — SPIRONOLACTONE 50 MG TABS: 50 | 90 days supply | Qty: 90 | Fill #1

## 2020-11-18 NOTE — Assessment & Plan Note (Signed)
Patient has made improvement and states someone in the ballpark is 60 to 70% improved.  Patient could potentially be a candidate for possibly osteopathic manipulation but still not all the way there.  We will start with formal physical therapy and I think that will be helpful.  Discussed icing regimen, core strengthening, continue the gabapentin.  Worsening pain advanced imaging may be warranted as well but likely patient is responding.  Follow-up again in 6 weeks

## 2020-11-18 NOTE — Progress Notes (Signed)
[place] Phone: [phone] Subjective:   Emily Craig, am serving as a scribe for Dr. Hulan Saas. This visit occurred during the SARS-CoV-2 public health emergency.  Safety protocols were in place, including screening questions prior to the visit, additional usage of staff PPE, and extensive cleaning of exam room while observing appropriate contact time as indicated for disinfecting solutions.  I'm seeing this patient by the request  of:  Midge Minium, MD  CC: Left hip and back pain follow-up  [id number]   10/27/2020 Patient is having worsening radicular symptoms.  Had some significantly previously 3 years ago.  At this point I do feel that repeat x-rays will be beneficial.  Prednisone and gabapentin given.  Discussed icing regimen.  Encourage patient to continue with the weight loss.  Worsening symptoms advanced imaging or formal physical therapy will be necessary.   Update 11/18/2020 Emily Craig is a 49 y.o. female coming in with complaint of back and left hip pain. Pain in back has been improving. Pain in left hip at inferior glute that radiates down into left knee. Pain occurring mostly at night. Taking gabapentin nightly. Pain occasionally radiates into her groin.   Xray Lumbar 10/27/2020 IMPRESSION: Stable degenerative change and anterolisthesis at L5-S1.  Xray Pelvis 10/27/2020 IMPRESSION: Mild degenerative change without acute abnormality    Past Medical History:  Diagnosis Date  . Arthritis    hands  . Hypertension   . Thyroid disease    Past Surgical History:  Procedure Laterality Date  . CHOLECYSTECTOMY    . TONSILLECTOMY AND ADENOIDECTOMY     Social History   Socioeconomic History  . Marital status: Married    Spouse name: [name]  . Number of children: 3  . Years of education: Therapist, sports  . Highest education level: Not on file  Occupational History  . 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 Smoking status: Never Smoker  . Smokeless tobacco: Never Used  Vaping Use  . Vaping Use: Never used  Substance and Sexual Activity  . Alcohol use: Yes    Alcohol/week: 0.0 standard drinks    Comment: rarely  . Drug use: Craig  . Sexual activity: Not on file  Other Topics Concern  . Not on file  Social History Narrative   Lives with her husband and their daughter.  Their sons (twins) stayed in [place] when they moved here June 2014.   Social Determinants of Health   Financial Resource Strain: Not on file  Food Insecurity: Not on file  Transportation Needs: Not on file  Physical Activity: Not on file  Stress: Not on file  Social Connections: Not on file   Allergies  Allergen Reactions  . Maxzide [Triamterene-Hctz] Swelling  . Penicillins     Allergy since childhood/unknown reaction  . Shrimp Extract Allergy Skin Test     Eats shrimp all the time per pt  . Sulfa Antibiotics Rash   Family History  Problem Relation Age of Onset  . Hypertension Mother   . Aortic aneurysm Mother   . Cancer Mother        pancreatic cancer  . Pancreatic cancer Mother   . Heart disease Father   . Hyperlipidemia Brother   . Hypertension Brother   . Diabetes Maternal Grandmother   . Heart disease Maternal Grandmother   . Cancer Maternal Grandmother  reproductive   . Liver disease Maternal Grandfather   . Stroke Paternal Grandmother   . Breast cancer Cousin 50       paternal side   . Colon cancer Neg Hx   . Esophageal cancer Neg Hx   . Stomach cancer Neg Hx   . Rectal cancer Neg Hx     Current Outpatient Medications (Endocrine & Metabolic):  .  levothyroxine (SYNTHROID) 175 MCG tablet, Take 1 tablet (175 mcg total) by mouth daily before breakfast. .  predniSONE (DELTASONE) 20 MG tablet, Take 2 tablets (40 mg total) by mouth daily with breakfast.  Current Outpatient Medications (Cardiovascular):  .   spironolactone (ALDACTONE) 50 MG tablet, Take 1 tablet (50 mg total) by mouth daily.  Current Outpatient Medications (Respiratory):  .  cetirizine (ZYRTEC) 10 MG tablet, Take 10 mg by mouth daily.    Current Outpatient Medications (Other):  .  gabapentin (NEURONTIN) 100 MG capsule, Take 2 capsules (200 mg total) by mouth at bedtime. Marland Kitchen  omeprazole (PRILOSEC) 20 MG capsule, Take 20 mg by mouth daily.   Reviewed prior external information including notes and imaging from  primary care provider As well as notes that were available from care everywhere and other healthcare systems.  Past medical history, social, surgical and family history all reviewed in electronic medical record.  Craig pertanent information unless stated regarding to the chief complaint.   Review of Systems:  Craig headache, visual changes, nausea, vomiting, diarrhea, constipation, dizziness, abdominal pain, skin rash, fevers, chills, night sweats, weight loss, swollen lymph nodes, body aches, joint swelling, chest pain, shortness of breath, mood changes. POSITIVE muscle aches  Objective  Blood pressure 120/82, pulse 72, height 5\' 6"  (1.676 m), weight 243 lb (110.2 kg), SpO2 99 %.   General: Craig apparent distress alert and oriented x3 mood and affect normal, dressed appropriately.  HEENT: Pupils equal, extraocular movements intact  Respiratory: Patient's speak in full sentences and does not appear short of breath  Cardiovascular: Craig lower extremity edema, non tender, Craig erythema  Patient's back exam does have some loss of lordosis.  Still has a mild positive straight leg test but was significantly improved.  Patient has 5 out of 5 strength to lower extremity.  Patient does have tenderness noted in the left piriformis neurovascular intact distally with Craig significant weakness.  Deep tendon reflexes are intact    Impression and Recommendations:     The above documentation has been reviewed and is accurate and complete Lyndal Pulley, DO

## 2020-11-18 NOTE — Patient Instructions (Signed)
PT[address]-back and piriformis Try gabapentin 300mg  at night for one week-send message if this helps See me in 6-7 weeks

## 2020-11-23 ENCOUNTER — Telehealth: Payer: No Typology Code available for payment source | Admitting: Nurse Practitioner

## 2020-11-23 ENCOUNTER — Other Ambulatory Visit: Payer: Self-pay | Admitting: Nurse Practitioner

## 2020-11-23 DIAGNOSIS — N3000 Acute cystitis without hematuria: Secondary | ICD-10-CM | POA: Diagnosis not present

## 2020-11-23 MED ORDER — NITROFURANTOIN MONOHYD MACRO 100 MG PO CAPS: 100.0000 mg | ORAL_CAPSULE | Freq: Two times a day (BID) | ORAL | 0 refills | Status: DC

## 2020-11-23 MED FILL — NITROFURANTOIN MONO-MCR 100: 100 | 5 days supply | Qty: 10 | Fill #0

## 2020-11-23 NOTE — Progress Notes (Signed)

## 2020-12-21 ENCOUNTER — Ambulatory Visit: Payer: No Typology Code available for payment source

## 2020-12-28 ENCOUNTER — Ambulatory Visit: Payer: No Typology Code available for payment source | Attending: Family Medicine

## 2020-12-28 ENCOUNTER — Other Ambulatory Visit: Payer: Self-pay

## 2020-12-28 DIAGNOSIS — R262 Difficulty in walking, not elsewhere classified: Secondary | ICD-10-CM | POA: Insufficient documentation

## 2020-12-28 DIAGNOSIS — G8929 Other chronic pain: Secondary | ICD-10-CM | POA: Diagnosis present

## 2020-12-28 DIAGNOSIS — M25552 Pain in left hip: Secondary | ICD-10-CM | POA: Diagnosis present

## 2020-12-28 DIAGNOSIS — M545 Low back pain, unspecified: Secondary | ICD-10-CM | POA: Insufficient documentation

## 2020-12-29 NOTE — Therapy (Addendum)
[place], [place], [address] Phone: [phone]   Fax:  [phone]  Physical Therapy Evaluation/Discharge  Patient Details  Name: Emily Craig MRN: [id number] Date of Birth: Dec 13, 1970 Referring Provider (PT): Lyndal Pulley, DO   Encounter Date: 12/28/2020   PT End of Session - 12/28/20 1221    Visit Number 1    Number of Visits 7    Date for PT Re-Evaluation 02/12/21    Authorization Type [place] at visit 6 and visit 10    PT Start Time 1221   pt arrived late   PT Stop Time 1310    PT Time Calculation (min) 49 min    Activity Tolerance Patient tolerated treatment well    Behavior During Therapy [facility] for tasks assessed/performed           Past Medical History:  Diagnosis Date  . Arthritis    hands  . Hypertension   . Thyroid disease     Past Surgical History:  Procedure Laterality Date  . CHOLECYSTECTOMY    . TONSILLECTOMY AND ADENOIDECTOMY      There were no vitals filed for this visit.    Subjective Assessment - 12/28/20 1225    Subjective "I've had low back pain that waxes and wanes depending on activity. I can feel the vertebrae slide and move, which isn't really painful itself, but I know I can expect to have more pain over the next few days. I also have piriformis syndrome on the L that bothers me right in the middle of the left butt cheek sometimes. I cut back on some of the medicine because I didn't notice that it was helping but it was making me more drowsy. My left hip hurts the most at night - I have to lay on my stomach and right side and really stretch my left leg out to get any relief."    Pertinent History See PMH above    Limitations House hold activities;Sitting;Lifting;Other (comment);Walking    How long can you sit comfortably? "probably about 30 minutes"    How long can you stand comfortably? denies limitation    How long can you walk  comfortably? denies limitation most days depending on symptoms    Diagnostic tests 10/27/2021: Lumbari spine - Stable degenerative change and anterolisthesis at L5-S1; Pelvis: Mild degenerative change of bilateral hips without acute abnormality    Patient Stated Goals Being able to perform daily activities without pain    Currently in Pain? Yes    Pain Score 3     Pain Location Back    Pain Orientation Left    Pain Descriptors / Indicators Aching    Pain Type Chronic pain   chronic but recent exacerbation   Pain Radiating Towards has radicular symptoms when piriformis symptoms are aggravated but hasn't happened in months    Pain Onset More than a month ago    Pain Frequency Constant    Aggravating Factors  sleeping, sitting, walking on uneven terrain    Pain Relieving Factors increasing activity/moving, modalities PRN    Effect of Pain on Daily Activities Pain during activities and difficulty sleeping/finding comfortable positions              [facility] PT Assessment - 12/29/20 0001      Assessment   Medical Diagnosis Acute low back pain with radicular symptoms, duration less than 6 weeks (M54.10), Piriformis syndrome, left (G57.02)    Referring Provider (  PT) Lyndal Pulley, DO    Onset Date/Surgical Date --   chronic pain but recent exacerbation > 1 month ago; L hip pain for at least a few months   Hand Dominance Right    Next MD Visit --   March 2022   Prior Therapy Not for current condition. PT for neck ~10 years ago      Precautions   Precautions None      Restrictions   Weight Bearing Restrictions No      Home Environment   Living Environment Private residence    Living Arrangements Spouse/significant other   husband   Available Help at Discharge Family    Type of [place] to enter    Entrance Stairs-Number of Steps 3    Entrance Stairs-Rails Can reach both    [place] Two level    Alternate Level Stairs-Number of Steps 14    Alternate  Level Stairs-Rails Right   R ascending   Home Equipment None      Prior Function   Level of Independence Independent    Vocation Full time employment    Associate Professor    Leisure Take her dog for walks, trips to the mountains, traveling to [place], reading, beginner yoga      Cognition   Overall Cognitive Status Within Functional Limits for tasks assessed      Observation/Other Assessments   Focus on Therapeutic Outcomes (FOTO)  Hip: 67% function; predicted 72% function. Lumbar 63% function; predicted 69% function      AROM   Lumbar Flexion Can place hands on floor - WFL without pain    Lumbar Extension WFL but painful in mid low back    Lumbar - Right Side Bend to superior pole of patella with pain with R SB    Lumbar - Left Side Bend to mid patella/lateral knee joint line    Lumbar - Right Rotation WFL with minimal mid LBP    Lumbar - Left Rotation WFL with minimal mid LBP      Strength   Overall Strength Comments BLE 4+/5 grossly with no pain during resistance      Palpation   Palpation comment TTP along L3-L5 SPs      Special Tests   Other special tests (+) FABER bilaterally - RLE with low back pain and LLE with groin/hip pain during FABER. (-) scour, FADDIR, thigh thrust, SLR. L hip relief with L LAD                      Objective measurements completed on examination: See above findings.       [facility] PT Treatment/Exercise - 12/29/20 0001      Self-Care   Self-Care Other Self-Care Comments    Other Self-Care Comments  See patient education      Exercises   Exercises Lumbar      Lumbar Exercises: Stretches   Lower Trunk Rotation 5 reps      Lumbar Exercises: Supine   Pelvic Tilt 10 reps    Pelvic Tilt Limitations tactile and verbal cues provided    Bridge 10 reps    Bridge Limitations cues for glute and core activation                  PT Education - 12/28/20 1317    Education Details HEP, anatomy of  condition, positioning to reduce L hip pain, FOTO and potential  progress with skilled PT, POC    Person(s) Educated Patient    Methods Explanation;Demonstration;Tactile cues;Verbal cues;Handout    Comprehension Verbalized understanding;Returned demonstration            PT Short Term Goals - 12/28/20 1314      PT SHORT TERM GOAL #1   Title Patient will be independent with initial HEP    Baseline Initial HEP provided at evaluation 12/28/2020    Time 3    Period Weeks    Status New    Target Date 01/18/21      PT SHORT TERM GOAL #2   Title Pt will report L LBP and L hip pain </= 3/10 throughout the night.    Baseline 3/10 at rest during eval with increased pain reported when trying to sleep at night and upon waking up in the morning    Time 3    Period Weeks    Status New    Target Date 01/18/21      PT SHORT TERM GOAL #3   Title Pt will report being able to sit for at least 1 hour without having increased LBP    Baseline Pt reports being limited to ~30 minutes of static sitting comfortably    Time 3    Period Weeks    Status New    Target Date 01/18/21             PT Long Term Goals - 12/28/20 1314      PT LONG TERM GOAL #1   Title Pt will be independent with advanced HEP.    Baseline Initial HEP provided at evaluation 12/28/2020    Time 6    Period Weeks    Status New    Target Date 02/08/21      PT LONG TERM GOAL #2   Title Pt will increase BLE MMT to 5/5 grossly    Baseline 4+/5    Time 6    Period Weeks    Status New    Target Date 02/08/21      PT LONG TERM GOAL #3   Title Pt will demonstrate lumbar and hip AROM WFL without limitations due to pain.    Baseline R sidebending < L sidebending limited due to pain. Pain during lumbar EXT    Time 6    Period Weeks    Status New    Target Date 02/08/21      PT LONG TERM GOAL #4   Title Pt's lumbar FOTO score will increase from 63% to 69% functional status to demonstrate improved perceived ability.     Baseline 63% function; predicted 69% function    Time 6    Period Weeks    Status New    Target Date 02/08/21      PT LONG TERM GOAL #5   Title Pt's hip FOTO score will increase from 67% to 72% functional status to demonstrate improved perceived ability.    Baseline 67% function; predicted 72% function    Time 6    Period Weeks    Status New    Target Date 02/08/21                  Plan - 12/28/20 1222    Clinical Impression Statement Patient is a 50 year old female who presents to [facility] with complaints of low back pain, L hip pain, and L piriformis syndrome. Examination reveals L anterior groin/hip pain with L FABER and LBP with R FABER.  She demonstrates lumbar AROM WFL with pain during EXT and R sidebending with decreased sidebending to right compared to left. Relief with LLE LAD in addition to subjective history suggests that L hip OA/degenerative changes are potentially contributing to patient's symptoms in addition to LBP. She should benefit from skilled PT intervention to address deficits and allow for improved tolerance with functional activities.    Personal Factors and Comorbidities Age;Comorbidity 3+;Fitness;Past/Current Experience;Time since onset of injury/illness/exacerbation    Comorbidities See PMH above    Examination-Activity Limitations Bed Mobility;Bend;Locomotion Level;Sit;Sleep;Stairs;Stand    Examination-Participation Restrictions Cleaning;Community Activity;Laundry;Occupation;Shop    Stability/Clinical Decision Making Evolving/Moderate complexity    Clinical Decision Making Moderate    Rehab Potential Good    PT Frequency 1x / week    PT Duration 6 weeks    PT Treatment/Interventions ADLs/Self Care Home Management;Aquatic Therapy;Cryotherapy;Electrical Stimulation;Iontophoresis 22m/ml Dexamethasone;Moist Heat;Traction;Neuromuscular re-education;Balance training;Therapeutic exercise;Therapeutic activities;Functional mobility training;Stair training;Gait  training;Patient/family education;Manual techniques;Energy conservation;Dry needling;Passive range of motion;Taping    PT Next Visit Plan Assess response to HEP. HS and hip FL stretches, butterfly stretch, hip hinge/body mechanics with bending/lifting, progress core and LE strengthening. Manual/modalities PRN, LLE LAD. SKTC/DKTC    PT Home Exercise Plan [id number] - LTR, PPT, bridges (glute and core activation)    Consulted and Agree with Plan of Care Patient           Patient will benefit from skilled therapeutic intervention in order to improve the following deficits and impairments:  Decreased activity tolerance,Decreased balance,Decreased mobility,Decreased strength,Decreased endurance,Decreased range of motion,Difficulty walking,Increased muscle spasms,Improper body mechanics,Postural dysfunction,Pain  Visit Diagnosis: Chronic low back pain, unspecified back pain laterality, unspecified whether sciatica present  Pain in left hip  Difficulty in walking, not elsewhere classified     Problem List Patient Active Problem List   Diagnosis Date Noted  . Vitamin D deficiency 07/28/2020  . Abdominal cramping 10/11/2018  . Physical exam 02/14/2017  . Nonallopathic lesion of lumbosacral region 01/16/2017  . Nonallopathic lesion of sacral region 01/16/2017  . Nonallopathic lesion of thoracic region 01/16/2017  . Acute low back pain with radicular symptoms, duration less than 6 weeks 12/25/2016  . Severe obesity (BMI >= 40) ([place] 08/18/2016  . Dermatitis 08/05/2015  . Reflux 07/05/2015  . Hypothyroidism 12/01/2013    PHYSICAL THERAPY DISCHARGE SUMMARY  Visits from Start of Care: 1  Current functional level related to goals / functional outcomes: See above   Remaining deficits: See above   Education / Equipment: See above  Plan: Patient agrees to discharge.  Patient goals were not met. Patient is being discharged due to financial reasons.  ?????    Patient had not returned  since initial evaluation. Spoke with patient, and she is pleased with her current level of function. She reports having decreased pain overall and agrees to discharge at this time.  KHaydee Monica PT, DPT 01/31/21 12:39 PM  [facility] Center[address]GLec[address] NAl[place] [address]Phone: [phone]  Fax:  [phone] Name: Emily NygardMRN: [id number] of Birth: [birth year]

## 2021-01-03 NOTE — Progress Notes (Signed)
  [name] [address] [place] Phone: [phone] Subjective:   I [name] am serving as a Education administrator for Dr. Hulan Saas.  This visit occurred during the SARS-CoV-2 public health emergency.  Safety protocols were in place, including screening questions prior to the visit, additional usage of staff PPE, and extensive cleaning of exam room while observing appropriate contact time as indicated for disinfecting solutions.   I'm seeing this patient by the request  of:  Midge Minium, MD  CC: Back pain follow-up  IRC:[id number]  Emily Craig is a 50 y.o. female coming in with complaint of back and neck pain. Patient states she is feeling better. Started PT last week. Has had 1 session. States she has some temporary soreness with the stretches/exercises. Doing them every other day. Walking a lot better. The hip is better than it has been in a long time.  Patient states that he is feeling about 80% better.  Still having more pain at night than anything else.  Medications patient has been prescribed: Gabapentin, Prednisone   Patient's x-rays of the lumbar spine did show degenerative changes and anterolisthesis at L5-S1    Reviewed prior external information including notes and imaging from previsou exam, outside providers and external EMR if available.   As well as notes that were available from care everywhere and other healthcare systems.  Past medical history, social, surgical and family history all reviewed in electronic medical record.  No pertanent information unless stated regarding to the chief complaint.   Past Medical History:  Diagnosis Date  . Arthritis    hands  . Hypertension   . Thyroid disease     Allergies  Allergen Reactions  . Maxzide [Triamterene-Hctz] Swelling  . Penicillins     Allergy since childhood/unknown reaction  . Shrimp Extract Allergy Skin Test     Eats shrimp all the time per pt  . Sulfa  Antibiotics Rash     Review of Systems:  No headache, visual changes, nausea, vomiting, diarrhea, constipation, dizziness, abdominal pain, skin rash, fevers, chills, night sweats, weight loss, swollen lymph nodes, body aches, joint swelling, chest pain, shortness of breath, mood changes. POSITIVE muscle aches  Objective  Blood pressure 130/84, pulse 80, height 5\' 6"  (1.676 m), weight 240 lb (108.9 kg), SpO2 98 %.   General: No apparent distress alert and oriented x3 mood and affect normal, dressed appropriately.  HEENT: Pupils equal, extraocular movements intact  Respiratory: Patient's speak in full sentences and does not appear short of breath  Cardiovascular: No lower extremity edema, non tender, no erythema  Gait normal with good balance and coordination.  MSK:  Non tender with full range of motion and good stability and symmetric strength and tone of shoulders, elbows, wrist, hip, knee and ankles bilaterally.  Back -mild loss of lordosis.  Significant improvement in range of motion of the left hip.  Negative Faber.  Negative straight leg test.  Mild pain over the paraspinal musculature of the lumbar spine left greater than right        Assessment and Plan:        The above documentation has been reviewed and is accurate and complete Lyndal Pulley, DO       Note: This dictation was prepared with Dragon dictation along with smaller phrase technology. Any transcriptional errors that result from this process are unintentional.

## 2021-01-04 ENCOUNTER — Encounter: Payer: Self-pay | Admitting: Family Medicine

## 2021-01-04 ENCOUNTER — Other Ambulatory Visit: Payer: Self-pay

## 2021-01-04 ENCOUNTER — Ambulatory Visit (INDEPENDENT_AMBULATORY_CARE_PROVIDER_SITE_OTHER): Payer: No Typology Code available for payment source | Admitting: Family Medicine

## 2021-01-04 ENCOUNTER — Ambulatory Visit: Payer: No Typology Code available for payment source

## 2021-01-04 DIAGNOSIS — M5410 Radiculopathy, site unspecified: Secondary | ICD-10-CM | POA: Diagnosis not present

## 2021-01-04 NOTE — Assessment & Plan Note (Signed)
Patient is doing much better at this time.  Encouraged her to continue to work with physical therapist.  Patient is doing much better and can decrease gabapentin if she would like to try.  Still will hold on any type of osteopathic manipulation at this point but will consider in the long run.  Follow-up with me again in 2 months.

## 2021-01-04 NOTE — Patient Instructions (Addendum)
Good to see you Glad you are doing better Keep up with PT as long as it helps Try to titrate off gabapentin  See me again in 2 months if not 100%

## 2021-01-13 ENCOUNTER — Ambulatory Visit: Payer: No Typology Code available for payment source

## 2021-01-19 ENCOUNTER — Ambulatory Visit: Payer: No Typology Code available for payment source | Admitting: Physical Therapy

## 2021-01-28 MED FILL — GABAPENTIN 100 MG CAPSULE: 100 | 90 days supply | Qty: 180 | Fill #1

## 2021-01-31 ENCOUNTER — Telehealth: Payer: Self-pay

## 2021-01-31 ENCOUNTER — Ambulatory Visit: Payer: No Typology Code available for payment source | Attending: Family Medicine

## 2021-01-31 NOTE — Telephone Encounter (Signed)
PT called and spoke with patient regarding missed appointment today at 12:15pm. She explains that she cancelled her previous appointments due to having trouble making it from work, but her hip has also been feeling better overall. She did not realize she still had an appointment scheduled. Discussed discharging as patient is pleased with her current level of function. She states she has a follow up with physician that is also "if needed" since she has been doing well consistently. Informed her that she may request new PT referral from physician if needed in the future.  Haydee Monica, PT, DPT 01/31/21 12:37 PM

## 2021-02-15 LAB — HM PAP SMEAR

## 2021-02-21 ENCOUNTER — Other Ambulatory Visit (HOSPITAL_COMMUNITY): Payer: Self-pay

## 2021-02-21 ENCOUNTER — Other Ambulatory Visit: Payer: Self-pay | Admitting: Family Medicine

## 2021-02-21 MED ORDER — SPIRONOLACTONE 50 MG PO TABS
50.0000 mg | ORAL_TABLET | Freq: Every day | ORAL | 1 refills | Status: DC
Start: 2021-02-21 — End: 2021-08-29
  Filled 2021-02-21: qty 90, 90d supply, fill #0
  Filled 2021-05-11: qty 90, 90d supply, fill #1

## 2021-02-21 MED ORDER — LEVOTHYROXINE SODIUM 175 MCG PO TABS
175.0000 ug | ORAL_TABLET | Freq: Every day | ORAL | 1 refills | Status: DC
  Filled 2021-02-21: qty 90, 90d supply, fill #0
  Filled 2021-08-05: qty 90, 90d supply, fill #1

## 2021-03-03 NOTE — Progress Notes (Deleted)
Emily Craig Phone: [phone] Subjective:    I'm seeing this patient by the request  of:  Midge Minium, MD  CC:   [id number]   01/04/2021 Patient is doing much better at this time.  Encouraged her to continue to work with physical therapist.  Patient is doing much better and can decrease gabapentin if she would like to try.  Still will hold on any type of osteopathic manipulation at this point but will consider in the long run.  Follow-up with me again in 2 months.  Update 03/08/2021 Emily Craig is a 50 y.o. female coming in with complaint of LBP. Patient states        Past Medical History:  Diagnosis Date  . Arthritis    hands  . Hypertension   . Thyroid disease    Past Surgical History:  Procedure Laterality Date  . CHOLECYSTECTOMY    . TONSILLECTOMY AND ADENOIDECTOMY     Social History   Socioeconomic History  . Marital status: Married    Spouse name: [name]  . Number of children: 3  . Years of education: Therapist, sports  . Highest education level: Not on file  Occupational History  . Occupation: Programmer, multimedia: [employer]: [employer]  Tobacco Use  . Smoking status: Never Smoker  . Smokeless tobacco: Never Used  Vaping Use  . Vaping Use: Never used  Substance and Sexual Activity  . Alcohol use: Yes    Alcohol/week: 0.0 standard drinks    Comment: rarely  . Drug use: No  . Sexual activity: Not on file  Other Topics Concern  . Not on file  Social History Narrative   Lives with her husband and their daughter.  Their sons (twins) stayed in [place] when they moved here June 2014.   Social Determinants of Health   Financial Resource Strain: Not on file  Food Insecurity: Not on file  Transportation Needs: Not on file  Physical Activity: Not on file  Stress: Not on file  Social Connections: Not on file   Allergies  Allergen Reactions  . Maxzide [Triamterene-Hctz]  Swelling  . Penicillins     Allergy since childhood/unknown reaction  . Shrimp Extract Allergy Skin Test     Eats shrimp all the time per pt  . Sulfa Antibiotics Rash   Family History  Problem Relation Age of Onset  . Hypertension Mother   . Aortic aneurysm Mother   . Cancer Mother        pancreatic cancer  . Pancreatic cancer Mother   . Heart disease Father   . Hyperlipidemia Brother   . Hypertension Brother   . Diabetes Maternal Grandmother   . Heart disease Maternal Grandmother   . Cancer Maternal Grandmother        reproductive   . Liver disease Maternal Grandfather   . Stroke Paternal Grandmother   . Breast cancer Cousin 50       paternal side   . Colon cancer Neg Hx   . Esophageal cancer Neg Hx   . Stomach cancer Neg Hx   . Rectal cancer Neg Hx     Current Outpatient Medications (Endocrine & Metabolic):  .  levothyroxine (SYNTHROID) 175 MCG tablet, TAKE 1 TABLET (175 MCG TOTAL) BY MOUTH DAILY BEFORE BREAKFAST. Marland Kitchen  predniSONE (DELTASONE) 20 MG tablet, TAKE 2 TABLETS (40 MG TOTAL) BY MOUTH DAILY WITH BREAKFAST.  Current Outpatient Medications (  Cardiovascular):  .  spironolactone (ALDACTONE) 50 MG tablet, TAKE 1 TABLET (50 MG TOTAL) BY MOUTH DAILY.  Current Outpatient Medications (Respiratory):  .  cetirizine (ZYRTEC) 10 MG tablet, Take 10 mg by mouth daily.    Current Outpatient Medications (Other):  .  gabapentin (NEURONTIN) 100 MG capsule, TAKE 2 CAPSULES (200 MG TOTAL) BY MOUTH AT BEDTIME. .  Na Sulfate-K Sulfate-Mg Sulf 17.5-3.13-1.6 GM/177ML SOLN, TAKE AS DIRECTED. Marland Kitchen  nitrofurantoin, macrocrystal-monohydrate, (MACROBID) 100 MG capsule, TAKE 1 CAPSULE BY MOUTH 2 TIMES DAILY .  omeprazole (PRILOSEC) 20 MG capsule, Take 20 mg by mouth daily.   Reviewed prior external information including notes and imaging from  primary care provider As well as notes that were available from care everywhere and other healthcare systems.  Past medical history, social,  surgical and family history all reviewed in electronic medical record.  No pertanent information unless stated regarding to the chief complaint.   Review of Systems:  No headache, visual changes, nausea, vomiting, diarrhea, constipation, dizziness, abdominal pain, skin rash, fevers, chills, night sweats, weight loss, swollen lymph nodes, body aches, joint swelling, chest pain, shortness of breath, mood changes. POSITIVE muscle aches  Objective  There were no vitals taken for this visit.   General: No apparent distress alert and oriented x3 mood and affect normal, dressed appropriately.  HEENT: Pupils equal, extraocular movements intact  Respiratory: Patient's speak in full sentences and does not appear short of breath  Cardiovascular: No lower extremity edema, non tender, no erythema  Gait normal with good balance and coordination.  MSK:  Non tender with full range of motion and good stability and symmetric strength and tone of shoulders, elbows, wrist, hip, knee and ankles bilaterally.     Impression and Recommendations:     The above documentation has been reviewed and is accurate and complete Jacqualin Combes

## 2021-03-08 ENCOUNTER — Ambulatory Visit: Payer: No Typology Code available for payment source | Admitting: Family Medicine

## 2021-03-11 ENCOUNTER — Telehealth: Payer: No Typology Code available for payment source | Admitting: Physician Assistant

## 2021-03-11 DIAGNOSIS — R300 Dysuria: Secondary | ICD-10-CM | POA: Diagnosis not present

## 2021-03-11 MED ORDER — NITROFURANTOIN MONOHYD MACRO 100 MG PO CAPS: 100.0000 mg | ORAL_CAPSULE | Freq: Two times a day (BID) | ORAL | 0 refills | Status: DC

## 2021-03-11 NOTE — Progress Notes (Signed)
I have spent 5 minutes in review of e-visit questionnaire, review and updating patient chart, medical decision making and response to patient.   Armon Orvis Cody Ford Peddie, PA-C    

## 2021-03-11 NOTE — Progress Notes (Signed)
Message sent to patient requesting further input regarding current symptoms. Awaiting patient response.  

## 2021-03-11 NOTE — Progress Notes (Signed)

## 2021-05-04 ENCOUNTER — Encounter: Payer: Self-pay | Admitting: *Deleted

## 2021-05-11 ENCOUNTER — Other Ambulatory Visit (HOSPITAL_COMMUNITY): Payer: Self-pay

## 2021-05-11 MED FILL — Gabapentin Cap 100 MG: ORAL | 90 days supply | Qty: 180 | Fill #0 | Status: AC

## 2021-05-12 ENCOUNTER — Encounter: Payer: Self-pay | Admitting: Family Medicine

## 2021-05-13 ENCOUNTER — Other Ambulatory Visit (HOSPITAL_COMMUNITY): Payer: Self-pay

## 2021-05-24 DIAGNOSIS — Z0279 Encounter for issue of other medical certificate: Secondary | ICD-10-CM

## 2021-06-24 ENCOUNTER — Other Ambulatory Visit (HOSPITAL_COMMUNITY): Payer: Self-pay

## 2021-06-24 MED ORDER — QUICKVUE AT-HOME COVID-19 TEST VI KIT
PACK | 0 refills | Status: DC
  Filled 2021-06-24: qty 2, 2d supply, fill #0

## 2021-08-02 ENCOUNTER — Other Ambulatory Visit: Payer: Self-pay | Admitting: Family Medicine

## 2021-08-02 DIAGNOSIS — Z1231 Encounter for screening mammogram for malignant neoplasm of breast: Secondary | ICD-10-CM

## 2021-08-05 ENCOUNTER — Other Ambulatory Visit (HOSPITAL_COMMUNITY): Payer: Self-pay

## 2021-08-05 ENCOUNTER — Other Ambulatory Visit: Payer: Self-pay | Admitting: Family Medicine

## 2021-08-08 ENCOUNTER — Other Ambulatory Visit (HOSPITAL_COMMUNITY): Payer: Self-pay

## 2021-08-08 ENCOUNTER — Ambulatory Visit
Admission: RE | Admit: 2021-08-08 | Discharge: 2021-08-08 | Disposition: A | Discharge status: Home / Self Care | Payer: No Typology Code available for payment source | Source: Ambulatory Visit

## 2021-08-08 ENCOUNTER — Other Ambulatory Visit: Payer: Self-pay

## 2021-08-08 DIAGNOSIS — Z1231 Encounter for screening mammogram for malignant neoplasm of breast: Secondary | ICD-10-CM

## 2021-08-08 MED FILL — Gabapentin Cap 100 MG: ORAL | 90 days supply | Qty: 180 | Fill #1 | Status: AC

## 2021-08-09 ENCOUNTER — Other Ambulatory Visit: Payer: Self-pay | Admitting: Family Medicine

## 2021-08-09 DIAGNOSIS — Z1231 Encounter for screening mammogram for malignant neoplasm of breast: Secondary | ICD-10-CM

## 2021-08-12 ENCOUNTER — Other Ambulatory Visit (HOSPITAL_COMMUNITY): Payer: Self-pay

## 2021-08-24 ENCOUNTER — Encounter: Payer: Self-pay | Admitting: Family Medicine

## 2021-08-24 ENCOUNTER — Other Ambulatory Visit: Payer: Self-pay

## 2021-08-24 DIAGNOSIS — C4490 Unspecified malignant neoplasm of skin, unspecified: Secondary | ICD-10-CM

## 2021-08-25 NOTE — Telephone Encounter (Signed)
Faxed to [phone]    [email or web address]  [name] on 08/25/2021 [id number] - delivered at 08/25/2021 [phone]

## 2021-08-29 ENCOUNTER — Other Ambulatory Visit: Payer: Self-pay

## 2021-08-29 ENCOUNTER — Other Ambulatory Visit (HOSPITAL_COMMUNITY): Payer: Self-pay

## 2021-08-29 ENCOUNTER — Encounter: Payer: Self-pay | Admitting: Family Medicine

## 2021-08-29 MED ORDER — SPIRONOLACTONE 50 MG PO TABS
50.0000 mg | ORAL_TABLET | Freq: Every day | ORAL | 1 refills | Status: DC
  Filled 2021-08-29 (×3): qty 90, 90d supply, fill #0

## 2021-08-30 ENCOUNTER — Other Ambulatory Visit (HOSPITAL_COMMUNITY): Payer: Self-pay

## 2021-08-31 ENCOUNTER — Ambulatory Visit
Admission: RE | Admit: 2021-08-31 | Discharge: 2021-08-31 | Disposition: A | Discharge status: Home / Self Care | Payer: No Typology Code available for payment source | Source: Ambulatory Visit

## 2021-08-31 ENCOUNTER — Other Ambulatory Visit: Payer: Self-pay

## 2021-08-31 DIAGNOSIS — Z1231 Encounter for screening mammogram for malignant neoplasm of breast: Secondary | ICD-10-CM

## 2021-09-02 ENCOUNTER — Other Ambulatory Visit (HOSPITAL_COMMUNITY): Payer: Self-pay

## 2021-09-14 ENCOUNTER — Ambulatory Visit (INDEPENDENT_AMBULATORY_CARE_PROVIDER_SITE_OTHER): Payer: No Typology Code available for payment source | Admitting: Family Medicine

## 2021-09-14 ENCOUNTER — Encounter: Payer: Self-pay | Admitting: Family Medicine

## 2021-09-14 ENCOUNTER — Other Ambulatory Visit (HOSPITAL_COMMUNITY): Payer: Self-pay

## 2021-09-14 DIAGNOSIS — H6982 Other specified disorders of Eustachian tube, left ear: Secondary | ICD-10-CM | POA: Diagnosis not present

## 2021-09-14 DIAGNOSIS — F4321 Adjustment disorder with depressed mood: Secondary | ICD-10-CM | POA: Diagnosis not present

## 2021-09-14 DIAGNOSIS — M25552 Pain in left hip: Secondary | ICD-10-CM

## 2021-09-14 LAB — HEMOGLOBIN A1C: Hgb A1c MFr Bld: 6.1 % (ref 4.6–6.5)

## 2021-09-14 LAB — BASIC METABOLIC PANEL
BUN: 11 mg/dL (ref 6–23)
CO2: 29 mEq/L (ref 19–32)
Calcium: 8.7 mg/dL (ref 8.4–10.5)
Chloride: 103 mEq/L (ref 96–112)
Creatinine, Ser: 0.78 mg/dL (ref 0.40–1.20)
GFR: 87.18 mL/min (ref >60.00)
Glucose, Bld: 91 mg/dL (ref 70–99)
Potassium: 4.1 mEq/L (ref 3.5–5.1)
Sodium: 139 mEq/L (ref 135–145)

## 2021-09-14 LAB — CBC WITH DIFFERENTIAL/PLATELET
Basophils Absolute: 0.1 10*3/uL (ref 0.0–0.1)
Basophils Relative: 1.1 % (ref 0.0–3.0)
Eosinophils Absolute: 0.2 10*3/uL (ref 0.0–0.7)
Eosinophils Relative: 2.9 % (ref 0.0–5.0)
HCT: 35.5 % — ABNORMAL LOW (ref 36.0–46.0)
Hemoglobin: 11.7 g/dL — ABNORMAL LOW (ref 12.0–15.0)
Lymphocytes Relative: 42 % (ref 12.0–46.0)
Lymphs Abs: 2.5 10*3/uL (ref 0.7–4.0)
MCHC: 32.8 g/dL (ref 30.0–36.0)
MCV: 84 fl (ref 78.0–100.0)
Monocytes Absolute: 0.5 10*3/uL (ref 0.1–1.0)
Monocytes Relative: 8.4 % (ref 3.0–12.0)
Neutro Abs: 2.7 10*3/uL (ref 1.4–7.7)
Neutrophils Relative %: 45.6 % (ref 43.0–77.0)
Platelets: 269 10*3/uL (ref 150.0–400.0)
RBC: 4.23 Mil/uL (ref 3.87–5.11)
RDW: 13.8 % (ref 11.5–15.5)
WBC: 5.9 10*3/uL (ref 4.0–10.5)

## 2021-09-14 LAB — LIPID PANEL
Cholesterol: 210 mg/dL — ABNORMAL HIGH (ref 0–200)
HDL: 65.3 mg/dL (ref >39.00)
LDL Cholesterol: 133 mg/dL — ABNORMAL HIGH (ref 0–99)
NonHDL: 144.26
Total CHOL/HDL Ratio: 3
Triglycerides: 55 mg/dL (ref 0.0–149.0)
VLDL: 11 mg/dL (ref 0.0–40.0)

## 2021-09-14 LAB — HEPATIC FUNCTION PANEL
ALT: 20 U/L (ref 0–35)
AST: 16 U/L (ref 0–37)
Albumin: 4.1 g/dL (ref 3.5–5.2)
Alkaline Phosphatase: 51 U/L (ref 39–117)
Bilirubin, Direct: 0.1 mg/dL (ref 0.0–0.3)
Total Bilirubin: 0.3 mg/dL (ref 0.2–1.2)
Total Protein: 6.8 g/dL (ref 6.0–8.3)

## 2021-09-14 LAB — TSH: TSH: 7.77 u[IU]/mL — ABNORMAL HIGH (ref 0.35–5.50)

## 2021-09-14 LAB — VITAMIN D 25 HYDROXY (VIT D DEFICIENCY, FRACTURES): VITD: 32.18 ng/mL (ref 30.00–100.00)

## 2021-09-14 MED ORDER — WEGOVY 0.25 MG/0.5ML ~~LOC~~ SOAJ
0.2500 mg | SUBCUTANEOUS | 1 refills | Status: DC
  Filled 2021-09-14: qty 2, fill #0
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
## 2021-09-14 NOTE — Patient Instructions (Signed)
Follow up in 1 month to recheck weight loss progress We'll notify you of your lab results and make any changes if needed START the [facility]- 0.25mg  weekly Continue to work on healthy diet (low carb) and regular exercise- you can do it! We'll call you with your Sports Medicine appt START daily Claritin or Zyrtec and daily Flonase to improve your ear pain/pressure Call with any questions or concerns Hang in there!  You can do this!!!
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
## 2021-09-15 ENCOUNTER — Encounter: Payer: Self-pay | Admitting: Family Medicine

## 2021-09-16 ENCOUNTER — Other Ambulatory Visit (HOSPITAL_COMMUNITY): Payer: Self-pay

## 2021-09-16 ENCOUNTER — Other Ambulatory Visit: Payer: Self-pay | Admitting: Family Medicine

## 2021-09-16 MED ORDER — OZEMPIC (0.25 OR 0.5 MG/DOSE) 2 MG/1.5ML ~~LOC~~ SOPN
0.2500 mg | PEN_INJECTOR | SUBCUTANEOUS | 0 refills | Status: DC
  Filled 2021-09-16: qty 1.5, 56d supply, fill #0

## 2021-09-16 MED ORDER — CARESTART COVID-19 HOME TEST VI KIT
PACK | 0 refills | Status: DC
Start: 2021-09-16 — End: 2021-12-08
  Filled 2021-09-16: qty 4, 4d supply, fill #0

## 2021-09-16 NOTE — Progress Notes (Signed)
Will send Ozempic to pharmacy for both pre-diabetes and weight loss

## 2021-10-03 NOTE — Progress Notes (Signed)
 Emily Craig D.O. [facility] [address] [place] [address] Phone: [phone] Subjective:   I, Emily Craig, am serving as a scribe for Dr. Zachary Craig.  This visit occurred during the SARS-CoV-2 public health emergency.  Safety protocols were in place, including screening questions prior to the visit, additional usage of staff PPE, and extensive cleaning of exam room while observing appropriate contact time as indicated for disinfecting solutions.    I'm seeing this patient by the request  of:  Craig, Emily E, MD  CC: Low back pain and left hip pain  HPI:Subjective  01/04/2021 Patient is doing much better at this time.  Encouraged her to continue to work with physical therapist.  Patient is doing much better and can decrease gabapentin if she would like to try.  Still will hold on any type of osteopathic manipulation at this point but will consider in the long run.  Follow-up with me again in 2 months.  Update 10/04/2021 Emily Craig is a 50 y.o. female coming in with complaint of low back, L hip pain that has been worsening since this summer. Patient states that her pain radiates into L thigh, L groin and L calf. Patient notices pain throughout her day but with no specific movements. Pain worsens at night. Uses gabapentin 200mg. Does stretches in the morning in bed. Has a hard time to get to PT. Patient also notes having flat feet and painful vericose vein in L calf that is painful.   Xray lumbar 10/27/2020 IMPRESSION: Stable degenerative change and anterolisthesis at L5-S1.  Xray pelvis 10/27/2020 IMPRESSION: Mild degenerative change without acute abnormality     Past Medical History:  Diagnosis Date   Arthritis    hands   Hypertension    Thyroid disease    Past Surgical History:  Procedure Laterality Date   CHOLECYSTECTOMY     TONSILLECTOMY AND ADENOIDECTOMY     Social History   Socioeconomic History   Marital status: Married     Spouse name: Randy   Number of children: 3   Years of education: RN   Highest education level: Not on file  Occupational History   Occupation: RN    Employer: [employer]    Comment: [employer]  Tobacco Use   Smoking status: Never   Smokeless tobacco: Never  Vaping Use   Vaping Use: Never used  Substance and Sexual Activity   Alcohol use: Yes    Alcohol/week: 0.0 standard drinks    Comment: rarely   Drug use: No   Sexual activity: Not on file  Other Topics Concern   Not on file  Social History Narrative   Lives with her husband and their daughter.  Their sons (twins) stayed in [place] when they moved here June 2014.   Social Determinants of Health   Financial Resource Strain: Not on file  Food Insecurity: Not on file  Transportation Needs: Not on file  Physical Activity: Not on file  Stress: Not on file  Social Connections: Not on file   Allergies  Allergen Reactions   Maxzide [Triamterene-Hctz] Swelling   Penicillins     Allergy since childhood/unknown reaction   Shrimp Extract Allergy Skin Test     Eats shrimp all the time per pt   Sulfa Antibiotics Rash   Family History  Problem Relation Age of Onset   Hypertension Mother    Aortic aneurysm Mother    Cancer Mother        pancreatic cancer     Pancreatic cancer Mother    Heart disease Father    Hyperlipidemia Brother    Hypertension Brother    Diabetes Maternal Grandmother    Heart disease Maternal Grandmother    Cancer Maternal Grandmother        reproductive    Liver disease Maternal Grandfather    Stroke Paternal Grandmother    Breast cancer Cousin 61       paternal side    Colon cancer Neg Hx    Esophageal cancer Neg Hx    Stomach cancer Neg Hx    Rectal cancer Neg Hx     Current Outpatient Medications (Endocrine & Metabolic):    levothyroxine (SYNTHROID) 175 MCG tablet, TAKE 1 TABLET (175 MCG TOTAL) BY MOUTH DAILY BEFORE BREAKFAST.   Semaglutide,0.25 or 0.5MG/DOS, (OZEMPIC, 0.25 OR 0.5  MG/DOSE,) 2 MG/1.5ML SOPN, Inject 0.25 mg into the skin once a week.  Current Outpatient Medications (Cardiovascular):    spironolactone (ALDACTONE) 50 MG tablet, Take 1 tablet (50 mg total) by mouth daily.  Current Outpatient Medications (Respiratory):    cetirizine (ZYRTEC) 10 MG tablet, Take 10 mg by mouth daily.    Current Outpatient Medications (Other):    COVID-19 At Home Antigen Test [facility] COVID-19 HOME TEST) KIT, Use as directed   omeprazole (PRILOSEC) 20 MG capsule, Take 20 mg by mouth daily.   gabapentin (NEURONTIN) 100 MG capsule, Take 3 capsules (300 mg total) by mouth at bedtime.    Review of Systems:  No headache, visual changes, nausea, vomiting, diarrhea, constipation, dizziness, abdominal pain, skin rash, fevers, chills, night sweats, weight loss, swollen lymph nodes, body aches, joint swelling, chest pain, shortness of breath, mood changes. POSITIVE muscle aches  Objective  Blood pressure 130/88, pulse 66, height 5' 6" (1.676 m), weight 265 lb (120.2 kg), SpO2 99 %.   General: No apparent distress alert and oriented x3 mood and affect normal, dressed appropriately.  HEENT: Pupils equal, extraocular movements intact  Respiratory: Patient's speak in full sentences and does not appear short of breath  Cardiovascular: Trace lower extremity edema, tender in the left calf minorly to palpation Gait antalgic Patient does have a positive straight leg test at 20 degrees with radicular symptoms in the L5 distribution.  Patient has weakness with dorsiflexion with 3+ out of 5 strength compared to the contralateral side. Swelling and range of motion also noted of the left hip compared to the right hip.   Impression and Recommendations:     The above documentation has been reviewed and is accurate and complete Emily Pulley, DO

## 2021-10-04 ENCOUNTER — Encounter: Payer: Self-pay | Admitting: Family Medicine

## 2021-10-04 ENCOUNTER — Other Ambulatory Visit: Payer: Self-pay

## 2021-10-04 ENCOUNTER — Other Ambulatory Visit (HOSPITAL_COMMUNITY): Payer: Self-pay

## 2021-10-04 ENCOUNTER — Ambulatory Visit (INDEPENDENT_AMBULATORY_CARE_PROVIDER_SITE_OTHER): Payer: No Typology Code available for payment source | Admitting: Family Medicine

## 2021-10-04 VITALS — BP 130/88 | HR 66 | Ht 66.0 in | Wt 265.0 lb

## 2021-10-04 DIAGNOSIS — M2550 Pain in unspecified joint: Secondary | ICD-10-CM | POA: Diagnosis not present

## 2021-10-04 DIAGNOSIS — M5410 Radiculopathy, site unspecified: Secondary | ICD-10-CM

## 2021-10-04 LAB — VITAMIN B12: Vitamin B-12: 643 pg/mL (ref 211–911)

## 2021-10-04 LAB — FERRITIN: Ferritin: 12.3 ng/mL (ref 10.0–291.0)

## 2021-10-04 MED ORDER — GABAPENTIN 100 MG PO CAPS
300.0000 mg | ORAL_CAPSULE | Freq: Every day | ORAL | 2 refills | Status: DC
  Filled 2021-10-04 – 2021-11-24 (×2): qty 180, 60d supply, fill #0
  Filled 2022-02-20: qty 180, 60d supply, fill #1
  Filled 2022-07-07: qty 180, 60d supply, fill #2

## 2021-10-04 NOTE — Assessment & Plan Note (Signed)
Patient has had back pain for quite some time.  Patient states that it is worsening at this point.  Patient does have a mild positive straight leg test noted on the left side as well with potential weakness noted.  We discussed which activities to do which wants to avoid.  Patient can increase gabapentin if necessary.  Patient is having leg pain so we will get a D-dimer with patient having recent travel history but patient states that this is intermittent and feels more like the nerve.  Depending on imaging then we will discuss further treatment options and if patient is a candidate for potential injection.  Patient has failed all conservative therapy over the course of the year including formal physical therapy, home exercises, medications and osteopathic manipulation.

## 2021-10-04 NOTE — Patient Instructions (Addendum)
Increase Gabapentin to 300mg  Ibuprofen 3 pills 3x a day for 3 days [place] [phone] Call Today  When we receive your results we will contact you.  See you again in 5-6 weeks

## 2021-10-05 ENCOUNTER — Other Ambulatory Visit: Payer: Self-pay

## 2021-10-05 ENCOUNTER — Encounter: Payer: Self-pay | Admitting: Family Medicine

## 2021-10-05 DIAGNOSIS — M2550 Pain in unspecified joint: Secondary | ICD-10-CM

## 2021-10-05 DIAGNOSIS — M79662 Pain in left lower leg: Secondary | ICD-10-CM

## 2021-10-05 LAB — D-DIMER, QUANTITATIVE: D-Dimer, Quant: 0.57 mcg/mL FEU — ABNORMAL HIGH (ref <0.50)

## 2021-10-05 LAB — IBC PANEL
Iron: 75 ug/dL (ref 42–145)
Saturation Ratios: 17.6 % — ABNORMAL LOW (ref 20.0–50.0)
TIBC: 425.6 ug/dL (ref 250.0–450.0)
Transferrin: 304 mg/dL (ref 212.0–360.0)

## 2021-10-05 NOTE — Progress Notes (Signed)
Doppler scheduled: 10/07/2021, 3:00pm. Patient notified.

## 2021-10-06 ENCOUNTER — Other Ambulatory Visit: Payer: Self-pay

## 2021-10-06 ENCOUNTER — Ambulatory Visit (HOSPITAL_COMMUNITY)
Admission: RE | Admit: 2021-10-06 | Discharge: 2021-10-06 | Disposition: A | Discharge status: Home / Self Care | Payer: No Typology Code available for payment source | Source: Ambulatory Visit | Attending: Cardiology | Admitting: Cardiology

## 2021-10-06 DIAGNOSIS — M79662 Pain in left lower leg: Secondary | ICD-10-CM | POA: Insufficient documentation

## 2021-10-07 ENCOUNTER — Encounter (HOSPITAL_COMMUNITY): Payer: No Typology Code available for payment source

## 2021-10-13 ENCOUNTER — Encounter: Payer: Self-pay | Admitting: Family Medicine

## 2021-10-13 ENCOUNTER — Ambulatory Visit (INDEPENDENT_AMBULATORY_CARE_PROVIDER_SITE_OTHER): Payer: No Typology Code available for payment source | Admitting: Family Medicine

## 2021-10-13 ENCOUNTER — Other Ambulatory Visit: Payer: Self-pay | Admitting: Family Medicine

## 2021-10-13 ENCOUNTER — Other Ambulatory Visit (HOSPITAL_COMMUNITY): Payer: Self-pay

## 2021-10-13 DIAGNOSIS — E039 Hypothyroidism, unspecified: Secondary | ICD-10-CM | POA: Diagnosis not present

## 2021-10-13 DIAGNOSIS — F4321 Adjustment disorder with depressed mood: Secondary | ICD-10-CM | POA: Diagnosis not present

## 2021-10-13 LAB — TSH: TSH: 4.51 u[IU]/mL (ref 0.35–5.50)

## 2021-10-13 MED ORDER — OZEMPIC (0.25 OR 0.5 MG/DOSE) 2 MG/1.5ML ~~LOC~~ SOPN
0.5000 mg | PEN_INJECTOR | SUBCUTANEOUS | 3 refills | Status: DC
  Filled 2021-10-13 (×2): qty 1.5, 28d supply, fill #0

## 2021-10-13 MED ORDER — LEVOTHYROXINE SODIUM 175 MCG PO TABS
175.0000 ug | ORAL_TABLET | Freq: Every day | ORAL | 1 refills | Status: DC
  Filled 2021-10-13 – 2021-11-24 (×2): qty 90, 90d supply, fill #0
  Filled 2022-04-13 – 2022-05-01 (×2): qty 90, 90d supply, fill #1

## 2021-10-13 MED ORDER — SPIRONOLACTONE 50 MG PO TABS
50.0000 mg | ORAL_TABLET | Freq: Every day | ORAL | 1 refills | Status: DC
  Filled 2021-10-13 – 2021-11-24 (×2): qty 90, 90d supply, fill #0
  Filled 2022-03-16: qty 90, 90d supply, fill #1

## 2021-10-13 NOTE — Assessment & Plan Note (Signed)
Chronic problem.  Last TSH level was mildly elevated.  Plan was to be more mindful of the time she took med to see if this normalized.  Check labs.  Adjust meds prn

## 2021-10-13 NOTE — Patient Instructions (Addendum)
Schedule your complete physical in 3 months We'll notify you of your lab results and make any changes if needed Keep up the good work!  You're doing great!!! As you continue with the dietary changes, try and add some regular physical activity Call with any questions or concerns Stay Safe!  Stay Healthy! Happy Holidays!!!

## 2021-10-13 NOTE — Assessment & Plan Note (Signed)
Pt is down 7 lbs since starting Ozempic.  She is tolerating medication w/o difficulty and would like to increase her dose to 0.5mg  weekly.  Applauded her efforts, encouraged her to add regular physical activity.  New prescription sent.
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
## 2021-10-17 ENCOUNTER — Other Ambulatory Visit (HOSPITAL_COMMUNITY): Payer: Self-pay

## 2021-10-22 ENCOUNTER — Other Ambulatory Visit: Payer: No Typology Code available for payment source

## 2021-10-22 ENCOUNTER — Inpatient Hospital Stay: Admission: RE | Admit: 2021-10-22 | Payer: No Typology Code available for payment source | Source: Ambulatory Visit

## 2021-10-25 ENCOUNTER — Other Ambulatory Visit (HOSPITAL_COMMUNITY): Payer: Self-pay

## 2021-11-10 ENCOUNTER — Encounter: Payer: Self-pay | Admitting: Family Medicine

## 2021-11-11 NOTE — Progress Notes (Signed)
[facility] [place] [place] Phone: [phone] Subjective:   [name], am serving as a scribe for Dr. Hulan Saas. This visit occurred during the SARS-CoV-2 public health emergency.  Safety protocols were in place, including screening questions prior to the visit, additional usage of staff PPE, and extensive cleaning of exam room while observing appropriate contact time as indicated for disinfecting solutions.  I'm seeing this patient by the request  of:  Midge Minium, MD  CC: Back pain or left hip pain  PPJ:[id number]  10/04/2021 Patient has had back pain for quite some time.  Patient states that it is worsening at this point.  Patient does have a mild positive straight leg test noted on the left side as well with potential weakness noted.  We discussed which activities to do which wants to avoid.  Patient can increase gabapentin if necessary.  Patient is having leg pain so we will get a D-dimer with patient having recent travel history but patient states that this is intermittent and feels more like the nerve.  Depending on imaging then we will discuss further treatment options and if patient is a candidate for potential injection.  Patient has failed all conservative therapy over the course of the year including formal physical therapy, home exercises, medications and osteopathic manipulation.  Update 11/15/2021 Darien Mignogna is a 51 y.o. female coming in with complaint of low back pain and L hip pain. Patient states that her pain is not as bad as it once was but she is still in pain after sitting for prolonged periods. Pain will be burning in nature in groin that radiates down the leg. Most comfortable position is supine with knees bend at 90. Cannot sleep on side as this causes more back pain. Does stretch daily. Using 24m of gabapentin at night and is unsure if this is helping. Tried 3077mat night but did not notice a  big change in relief and in the morning she was more foggy.   Patient did have an MRI at the beginning of the year that did show the patient has significant facet arthropathy noted at the L4-L5 area as well as is the left sacroiliac joint arthritis.  Patient also has gluteal tendon tearing bilaterally.  This was independently visualized by me today.    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  [place]ins[place].   Social Determinants  of Health   Financial Resource Strain: Not on file  Food Insecurity: Not on file  Transportation Needs: Not on file  Physical Activity: Not on file  Stress: Not on file  Social Connections: Not on file   Allergies  Allergen Reactions   Maxzide [Triamterene-Hctz] Swelling   Penicillins     Allergy since childhood/unknown reaction   Shrimp Extract Allergy Skin Test     Eats shrimp all the time per pt   Sulfa Antibiotics Rash   Family History  Problem Relation Age of Onset   Hypertension Mother    Aortic aneurysm Mother    Cancer Mother        pancreatic cancer   Pancreatic cancer Mother     Heart disease Father    Hyperlipidemia Brother    Hypertension Brother    Diabetes Maternal Grandmother    Heart disease Maternal Grandmother    Cancer Maternal Grandmother        reproductive    Liver disease Maternal Grandfather    Stroke Paternal Grandmother    Breast cancer Cousin 58       paternal side    Colon cancer Neg Hx    Esophageal cancer Neg Hx    Stomach cancer Neg Hx    Rectal cancer Neg Hx     Current Outpatient Medications (Endocrine & Metabolic):    levothyroxine (SYNTHROID) 175 MCG tablet, Take 1 tablet (175 mcg total) by mouth daily before breakfast.   Semaglutide,0.25 or 0.5MG/DOS, (OZEMPIC, 0.25 OR 0.5 MG/DOSE,) 2 MG/1.5ML SOPN, Inject 0.5 mg into the skin once a week.  Current Outpatient Medications (Cardiovascular):    spironolactone (ALDACTONE) 50 MG tablet, Take 1 tablet (50 mg total) by mouth daily.  Current Outpatient Medications (Respiratory):    cetirizine (ZYRTEC) 10 MG tablet, Take 10 mg by mouth daily.    Current Outpatient Medications (Other):    COVID-19 At Home Antigen Test [facility] COVID-19 HOME TEST) KIT, Use as directed   gabapentin (NEURONTIN) 100 MG capsule, Take 3 capsules (300 mg total) by mouth at bedtime.   omeprazole (PRILOSEC) 20 MG capsule, Take 20 mg by mouth daily.   Reviewed prior external information including notes and imaging from  primary care provider As well as notes that were available from care everywhere and other healthcare systems.  Past medical history, social, surgical and family history all reviewed in electronic medical record.  No pertanent information unless stated regarding to the chief complaint.   Review of Systems:  No headache, visual changes, nausea, vomiting, diarrhea, constipation, dizziness, abdominal pain, skin rash, fevers, chills, night sweats, weight loss, swollen lymph nodes, body aches, joint swelling, chest pain, shortness of breath, mood changes. POSITIVE muscle aches  Objective  Blood  pressure 122/80, pulse 76, height _0  (1.676 m), weight 256 lb (116.1 kg), SpO2 99 %.   General: No apparent distress alert and oriented x3 mood and affect normal, dressed appropriately.  HEENT: Pupils equal, extraocular movements intact  Respiratory: Patient's speak in full sentences and does not appear short of breath  Cardiovascular: No lower extremity edema, non tender, no erythema  Gait normal with good balance and coordination.  MSK: Patient is uncomfortable.  He does have tightness noted of the straight leg on the left side.  Patient does have some mild numbness noted. Possible weakness noted of the gluteal tendons left greater than right as well.  Difficulty with Corky Sox left greater than right.   Impression and Recommendations:     The  above documentation has been reviewed and is accurate and complete Lyndal Pulley, DO

## 2021-11-13 ENCOUNTER — Ambulatory Visit
Admission: RE | Admit: 2021-11-13 | Discharge: 2021-11-13 | Disposition: A | Discharge status: Home / Self Care | Payer: No Typology Code available for payment source | Source: Ambulatory Visit | Attending: Family Medicine | Admitting: Family Medicine

## 2021-11-13 ENCOUNTER — Other Ambulatory Visit: Payer: Self-pay

## 2021-11-13 DIAGNOSIS — M5410 Radiculopathy, site unspecified: Secondary | ICD-10-CM

## 2021-11-13 IMAGING — MR MR LUMBAR SPINE W/O CM
4 of 6 series · 21 of 48 positions shown · non-contrast
Comparison: None.

CLINICAL DATA: Low back pain for greater than 6 weeks

EXAM:
MRI LUMBAR SPINE WITHOUT CONTRAST
TECHNIQUE: Multiplanar, multisequence MR imaging of the lumbar spine was
performed. No intravenous contrast was administered.

[Series 11: T2 · sagittal · 4.0mm · 0.73mm/px · 7 of 19 slices shown (1 of 3)]
[im 1/19]
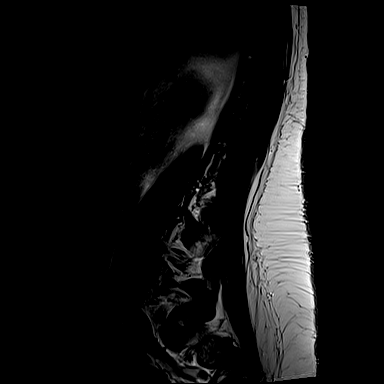
[im 4/19]
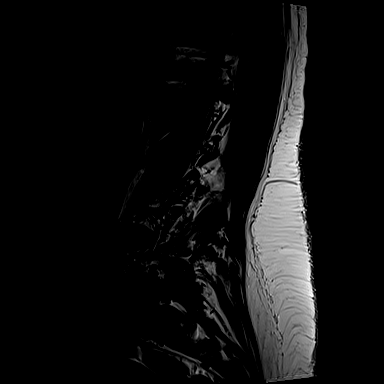
[im 7/19]
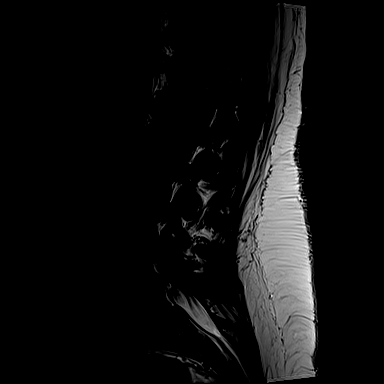
[im 10/19]
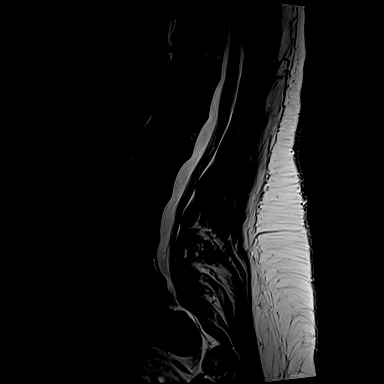
[im 13/19]
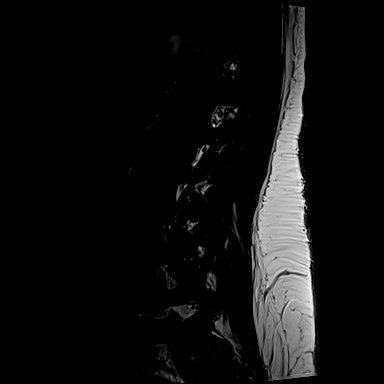
[im 16/19]
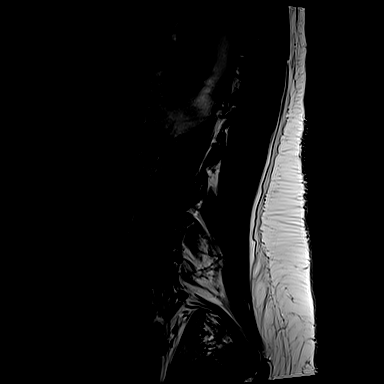
[im 19/19]
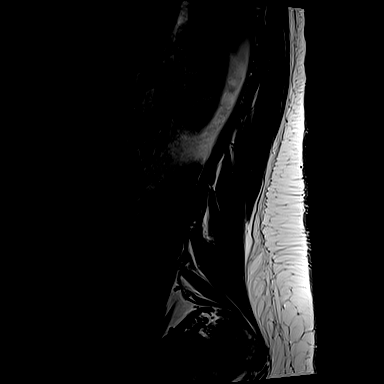

[Series 12: T1 · sagittal · 4.0mm · 0.73mm/px · 3 of 19 slices shown]
[im 4/19]
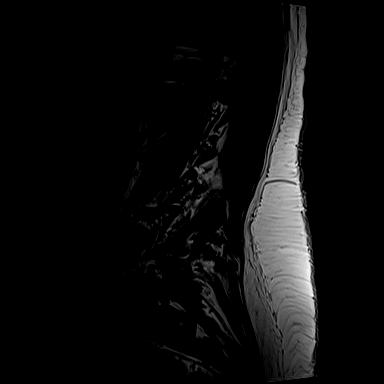
[im 11/19]
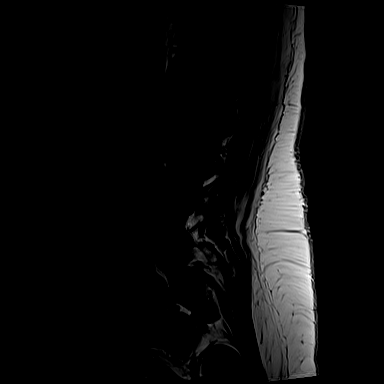
[im 19/19]
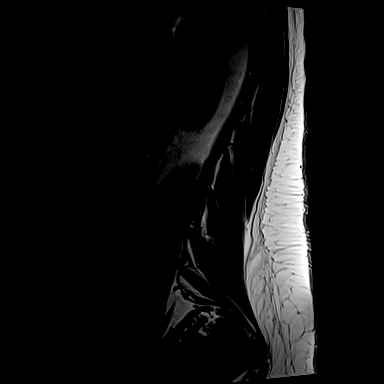

[Series 18: T2 · axial · 4.0mm · 0.28mm/px · z∈[-47,+146]mm · 8 of 39 slices shown (2 of 3)]
[im 1/39]
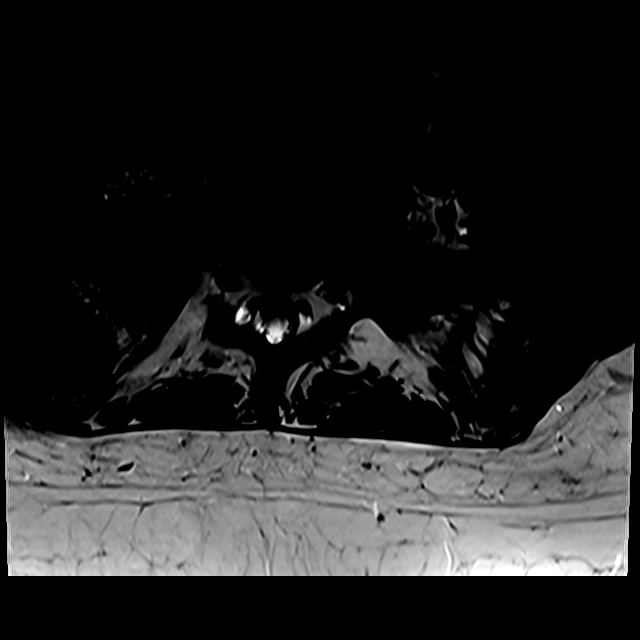
[im 7/39]
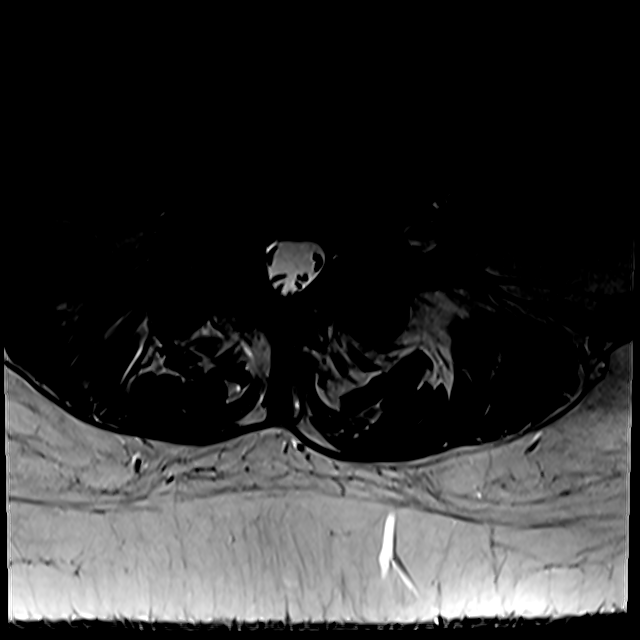
[im 11/39]
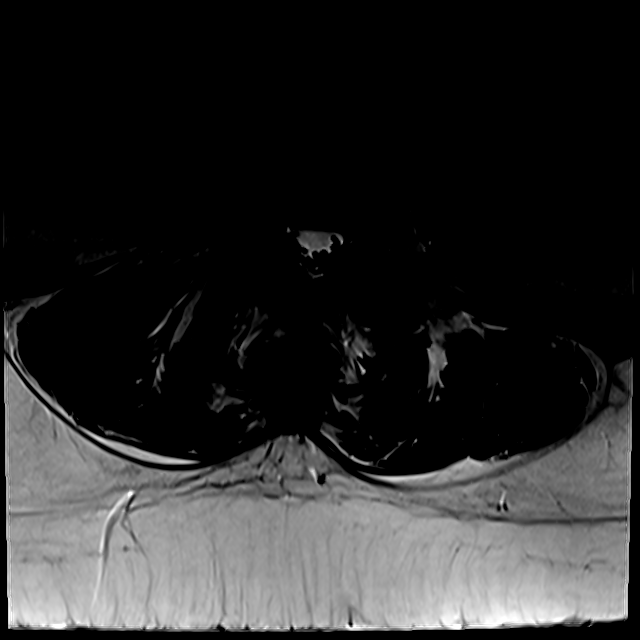
[im 18/39]
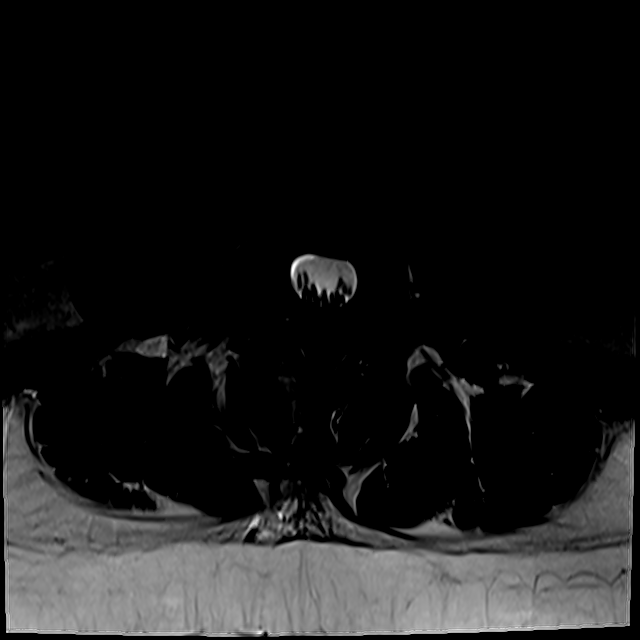
[im 21/39]
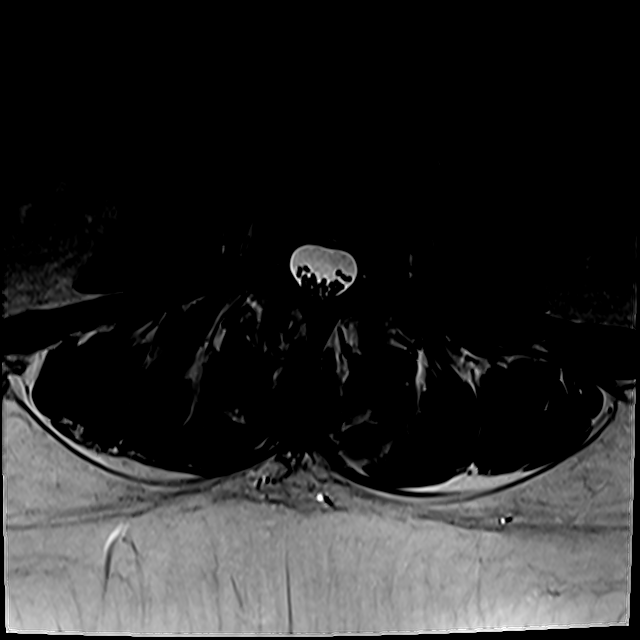
[im 28/39]
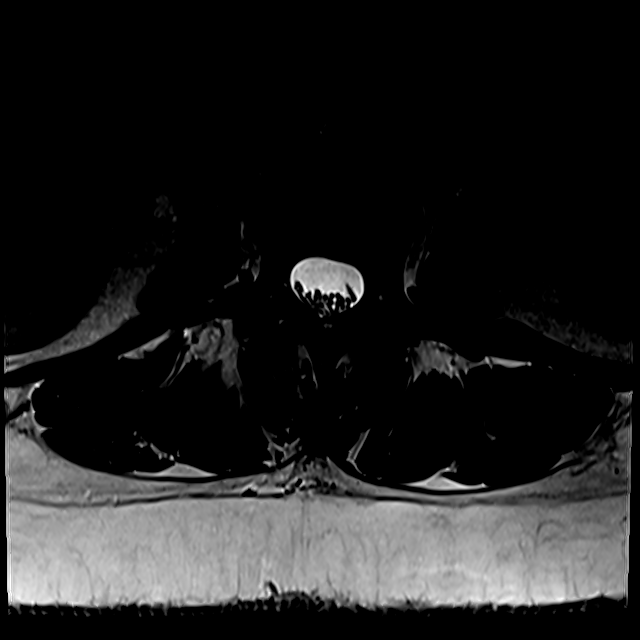
[im 32/39]
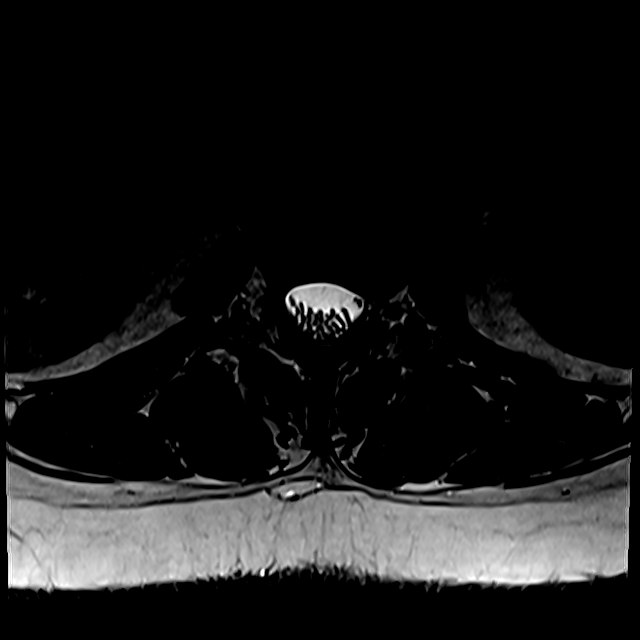
[im 35/39]
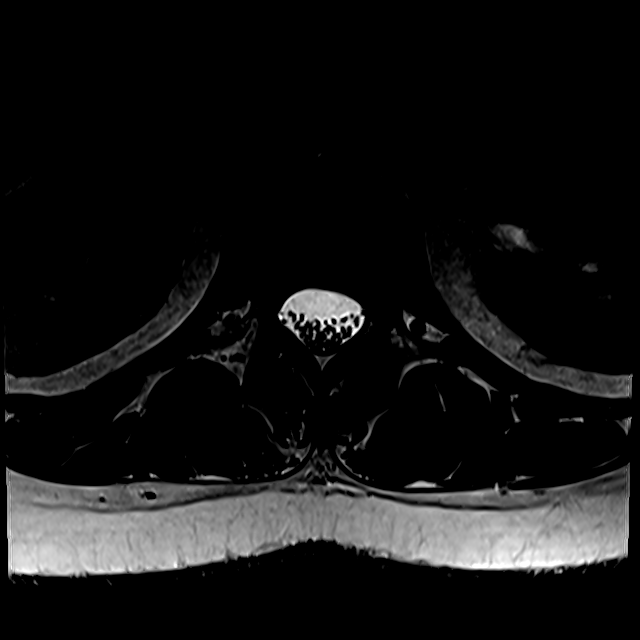

[Series 22: T2 · coronal · 5.0mm · 0.73mm/px · 3 of 15 slices shown (3 of 3)]
[im 1/15]
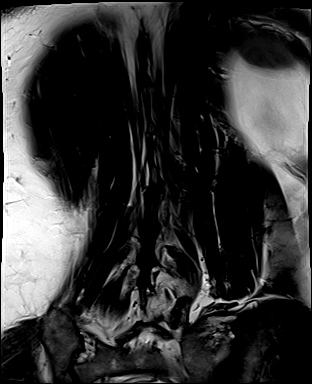
[im 8/15]
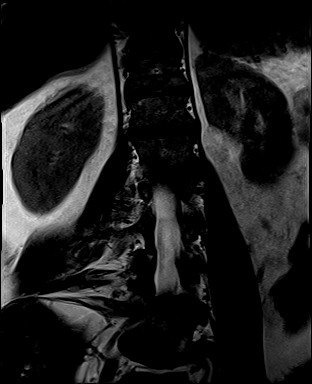
[im 15/15]
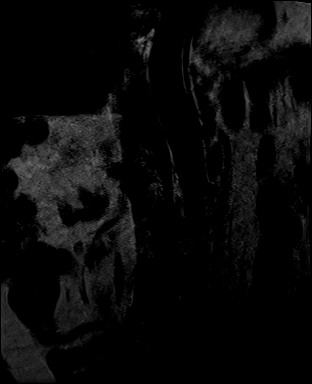

[21 of 48 positions shown; findings below may reference images not displayed]

FINDINGS: Segmentation:  Standard.

Alignment: Grade 1 retrolisthesis at L1-2 and L2-3 at L3-4. Grade 1
anterolisthesis at L4-5 and L5-6.

Vertebrae:  No fracture, evidence of discitis, or bone lesion.

Conus medullaris and cauda equina: Conus extends to the L1 level.
Conus and cauda equina appear normal.

Paraspinal and other soft tissues: Negative

Disc levels:

T12-L1: Normal.

L1-L2: Small disc bulge and mild facet hypertrophy. No spinal canal
stenosis. No neural foraminal stenosis.

L2-L3: Normal disc space and facet joints. No spinal canal stenosis.
No neural foraminal stenosis.

L3-L4: Normal disc space and facet joints. No spinal canal stenosis.
No neural foraminal stenosis.

L4-L5: Severe facet hypertrophy with small right asymmetric disc
bulge. No spinal canal stenosis. No neural foraminal stenosis.

L5-S1: Severe facet hypertrophy with disc uncovering. No spinal
canal stenosis. No neural foraminal stenosis.

Visualized sacrum: Normal.
IMPRESSION: 1. Severe facet arthrosis at L4-5 and L5-S1, which may serve as a
source of local low back pain.
2. Multilevel spondylolisthesis without spinal canal or neural
foraminal stenosis.

## 2021-11-13 IMAGING — MR MR PELVIS WO/W CM
5 of 8 series · 27 of 48 positions shown · IV contrast (multihance)
Comparison: Pelvis x-ray dated [DATE].

CLINICAL DATA: Left groin and hip pain radiating to the calf. No
injury or prior surgery.

EXAM:
MRI PELVIS WITHOUT AND WITH CONTRAST
TECHNIQUE: Multiplanar multisequence MR imaging of the pelvis was performed
both before and after administration of intravenous contrast.
CONTRAST:  20mL MULTIHANCE GADOBENATE DIMEGLUMINE 529 MG/ML IV SOLN

[Series 3: STIR · coronal · left · 3.0mm · 1.31mm/px · 5 of 44 slices shown]
[im 1/44]
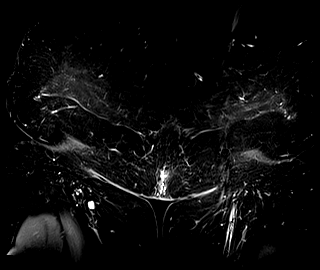
[im 11/44]
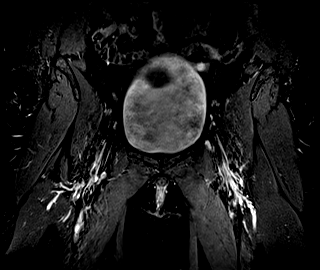
[im 22/44]
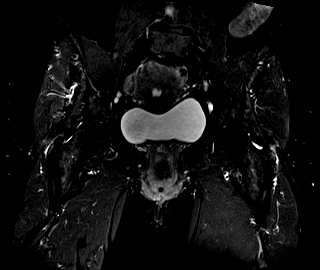
[im 33/44]
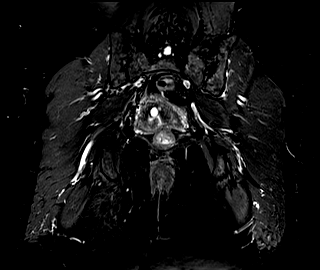
[im 44/44]
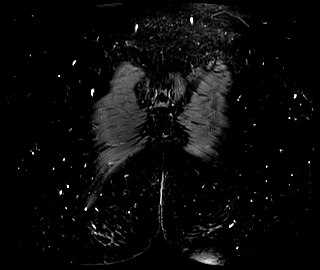

[Series 4: T1 · coronal · left · 3.0mm · 0.82mm/px · 6 of 44 slices shown (1 of 2)]
[im 1/44]
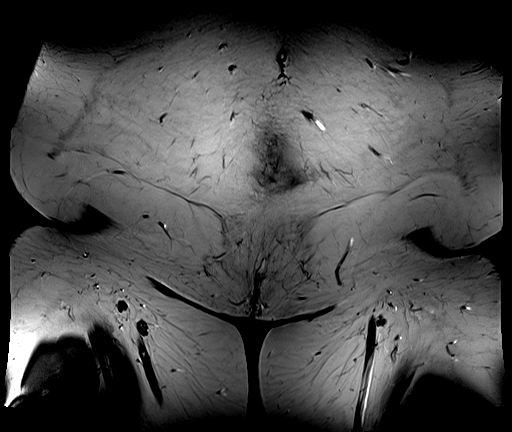
[im 9/44]
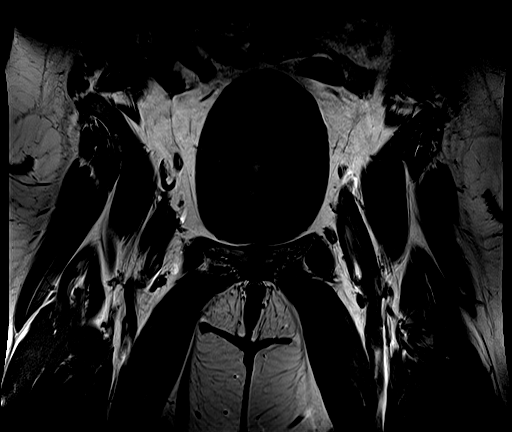
[im 18/44]
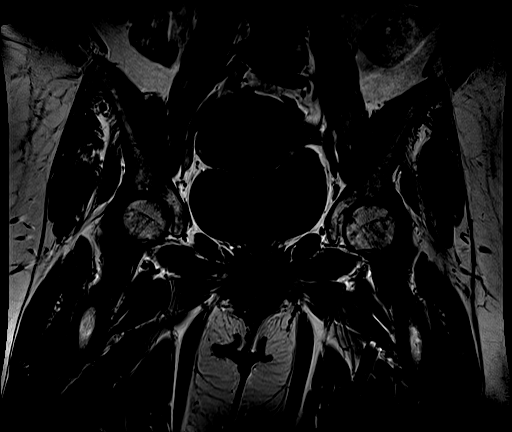
[im 26/44]
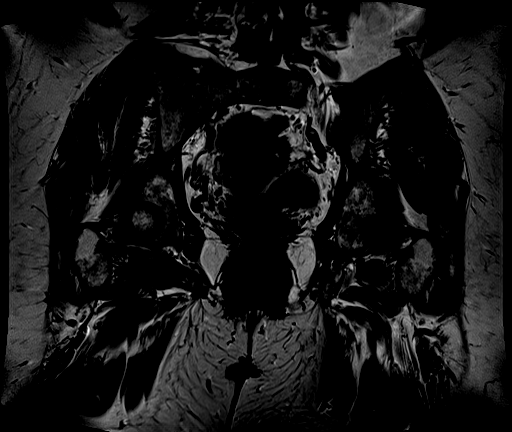
[im 35/44]
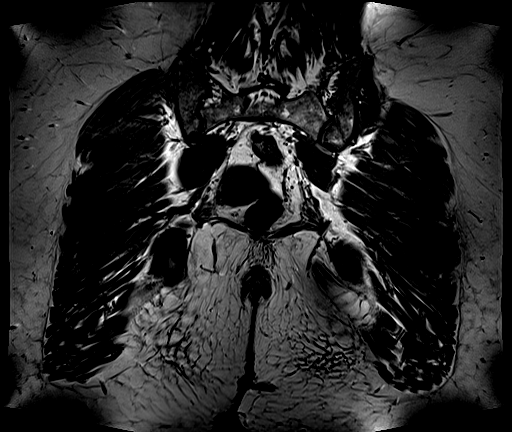
[im 44/44]
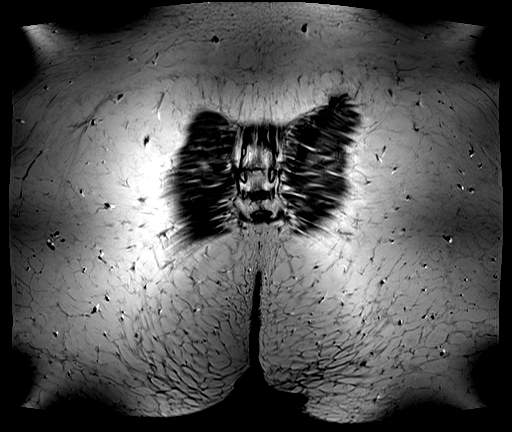

[Series 5: T1 · axial · left · 5.0mm · 0.78mm/px · z∈[-90,+183]mm · 6 of 40 slices shown (2 of 2)]
[im 1/40]
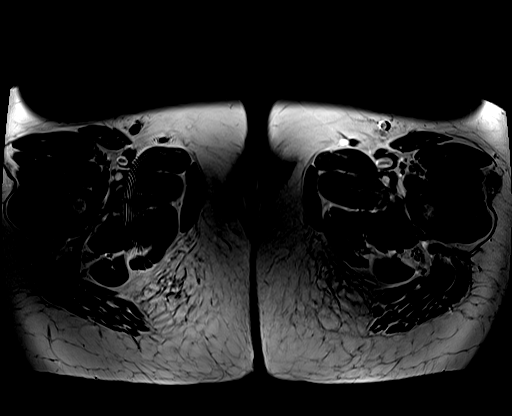
[im 8/40]
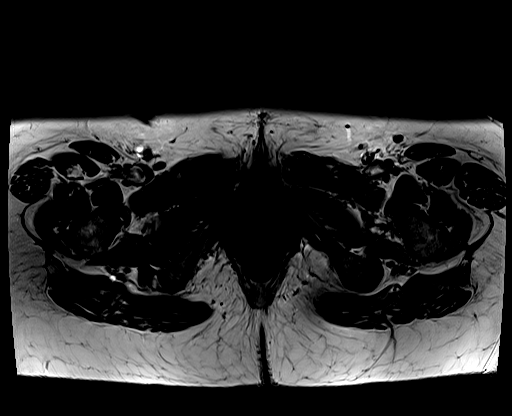
[im 16/40]
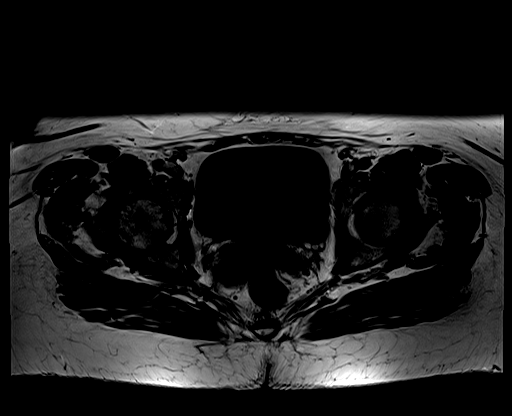
[im 24/40]
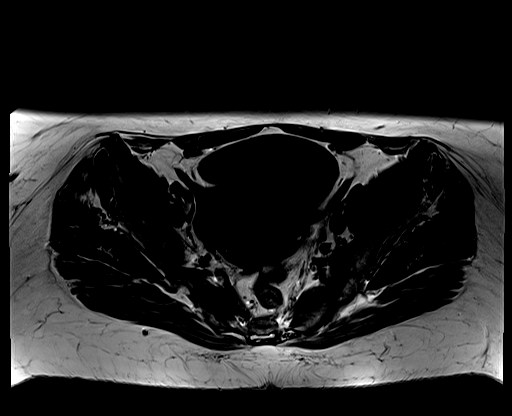
[im 32/40]
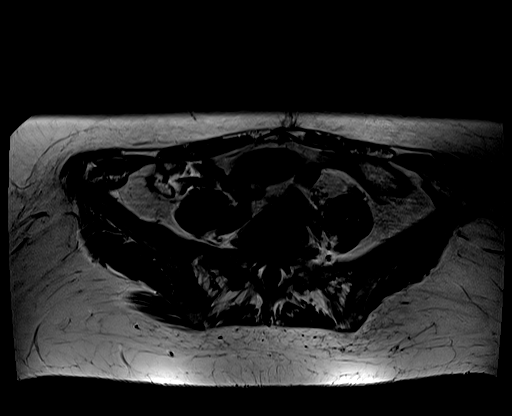
[im 40/40]
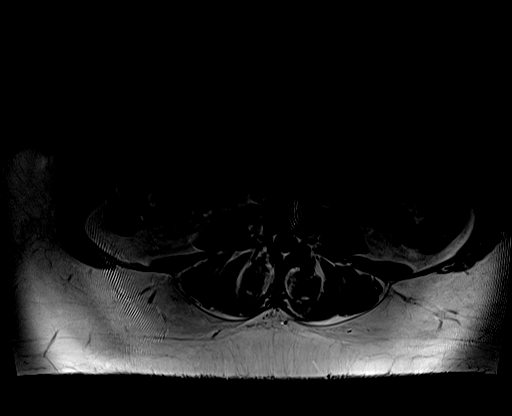

[Series 6: T2 fat-sat · axial · left · 5.0mm · 0.78mm/px · z∈[-90,+183]mm · 6 of 40 slices shown (1 of 2)]
[im 1/40]
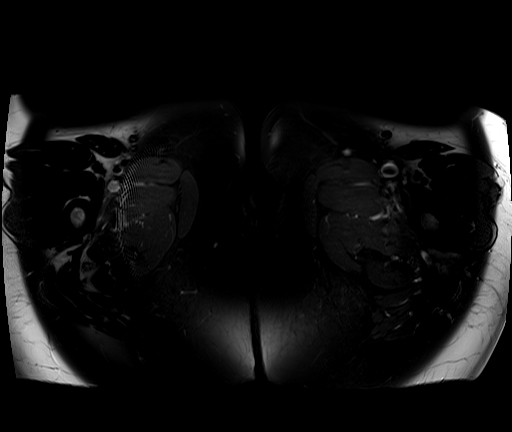
[im 8/40]
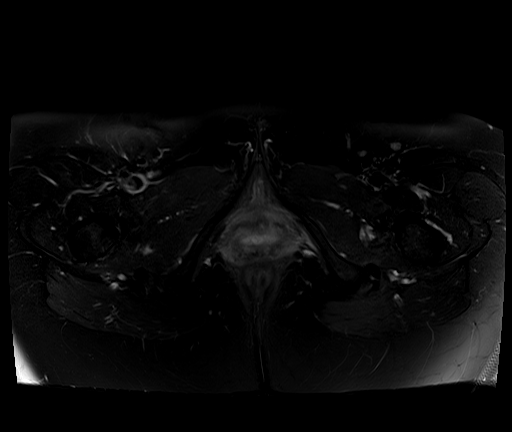
[im 16/40]
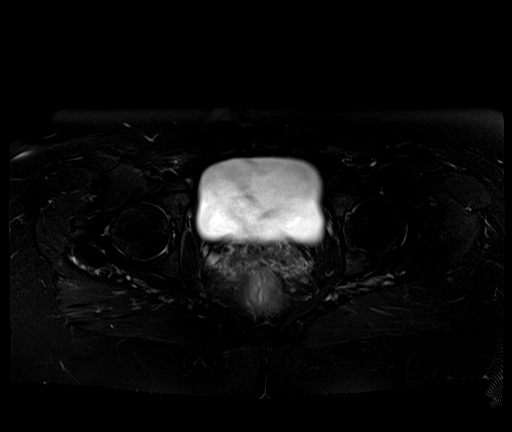
[im 24/40]
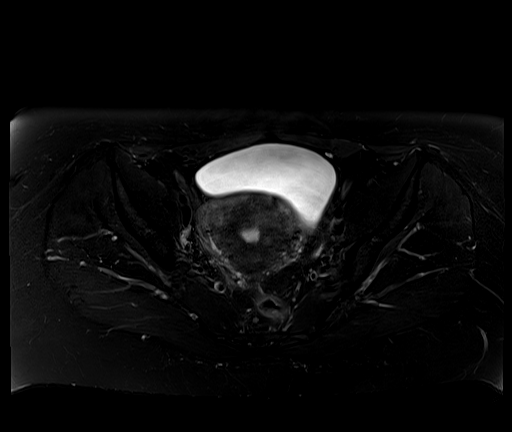
[im 32/40]
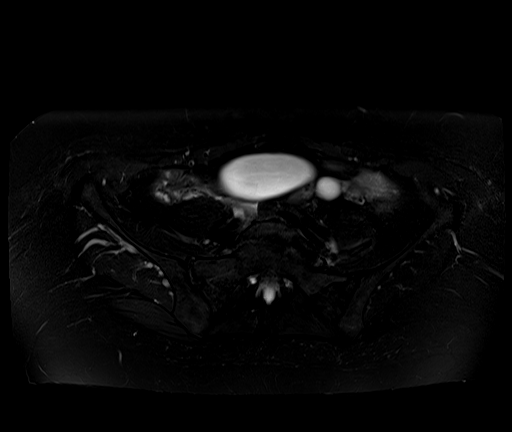
[im 40/40]
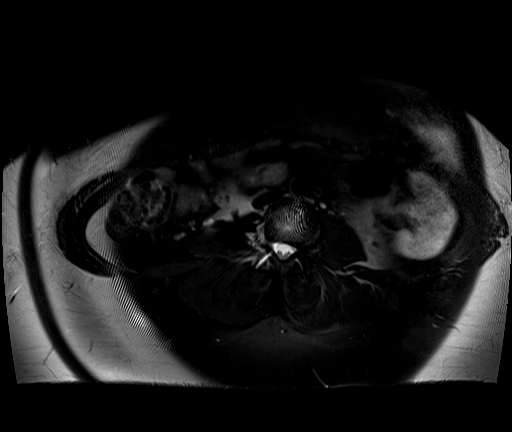

[Series 7: T2 fat-sat · sagittal · left · 5.0mm · 0.69mm/px · 4 of 48 slices shown (2 of 2)]
[im 1/48]
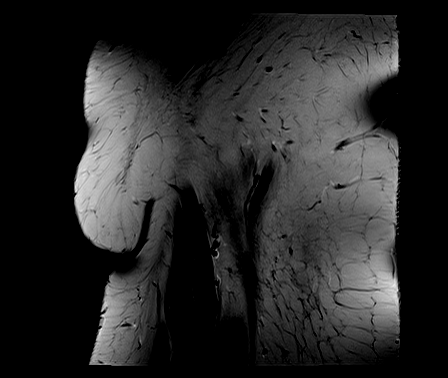
[im 8/48]
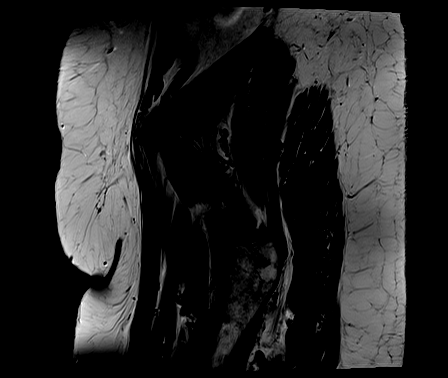
[im 16/48]
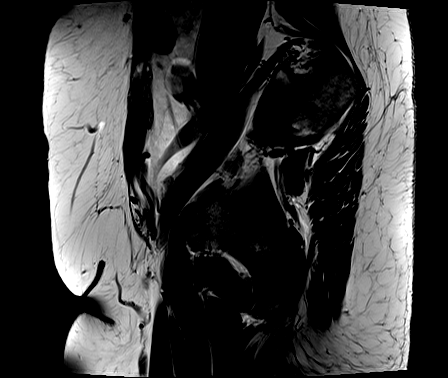
[im 24/48]
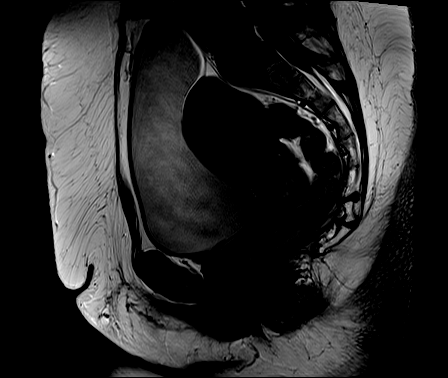

[27 of 48 positions shown; findings below may reference images not displayed]

FINDINGS: Bones: There is no evidence of acute fracture, dislocation or
avascular necrosis. No focal bone lesion. Degenerative changes of
the left greater than right sacroiliac joints.

Articular cartilage and labrum

Articular cartilage: No focal chondral defect or subchondral signal
abnormality identified.

Labrum: Grossly intact, although evaluation is limited due to lack
of intra-articular fluid. No paralabral abnormality.

Joint or bursal effusion

Joint effusion: No significant hip joint effusion.

Bursae: No focal periarticular fluid collection.

Muscles and tendons

Muscles and tendons: Bilateral gluteus minimus and right gluteus
medius partial tears. The iliopsoas and hamstring tendons are
unremarkable. No muscle edema or atrophy.

Other findings

Miscellaneous: The visualized internal pelvic contents appear
unremarkable. Multiple nabothian cysts. Dominant follicle in the
left ovary.
IMPRESSION: 1. Bilateral gluteus minimus and right gluteus medius partial tears.
2. Left-greater-than-right sacroiliac joint osteoarthritis.

## 2021-11-13 MED ORDER — GADOBENATE DIMEGLUMINE 529 MG/ML IV SOLN
20.0000 mL | Freq: Once | INTRAVENOUS | Status: AC | PRN
  Administered 2021-11-13: 20 mL via INTRAVENOUS

## 2021-11-15 ENCOUNTER — Other Ambulatory Visit: Payer: Self-pay

## 2021-11-15 ENCOUNTER — Encounter: Payer: Self-pay | Admitting: Family Medicine

## 2021-11-15 ENCOUNTER — Ambulatory Visit: Payer: No Typology Code available for payment source | Admitting: Family Medicine

## 2021-11-15 VITALS — BP 122/80 | HR 76 | Ht 66.0 in | Wt 256.0 lb

## 2021-11-15 DIAGNOSIS — M5410 Radiculopathy, site unspecified: Secondary | ICD-10-CM

## 2021-11-15 DIAGNOSIS — M5459 Other low back pain: Secondary | ICD-10-CM | POA: Diagnosis not present

## 2021-11-15 NOTE — Patient Instructions (Addendum)
L L4/L5 facet injections: [id number] See me 5-6 weeks after injection to see how you are doing

## 2021-11-15 NOTE — Assessment & Plan Note (Addendum)
Patient does have left greater than right facet arthropathy with possible nerve root impingement of the L4 and the L5 nerve root.  At this point I would like to try the possibility of the facet injections and see how patient responds to it.  My hope is that this does make a significant improvement.  Discussed posture and ergonomics.  Discussed which activities to do which wants to avoid.  Increase activity slowly.  After the injections patient will come back.  Patient does not make a significant improvement I do feel that the possibility of si joint injection on the left side over the possibility of gluteal tendon injections with PRP could be beneficial.  Total time discussing patient's imaging, reviewing patient's imaging before the patient as well as discussing different treatment options and medications greater than 33 minutes

## 2021-11-24 ENCOUNTER — Other Ambulatory Visit (HOSPITAL_COMMUNITY): Payer: Self-pay

## 2021-11-24 MED ORDER — OZEMPIC (1 MG/DOSE) 4 MG/3ML ~~LOC~~ SOPN
1.0000 mg | PEN_INJECTOR | SUBCUTANEOUS | 1 refills | Status: DC
Start: 2021-11-24 — End: 2022-02-20
  Filled 2021-11-24: qty 3, 28d supply, fill #0
  Filled 2021-12-21: qty 6, 56d supply, fill #1

## 2021-11-25 ENCOUNTER — Other Ambulatory Visit (HOSPITAL_COMMUNITY): Payer: Self-pay

## 2021-12-01 ENCOUNTER — Telehealth: Payer: Self-pay | Admitting: Family Medicine

## 2021-12-01 NOTE — Telephone Encounter (Signed)
Peer to peer is needed for the requested facet injections (cpt code: [id number]).   To complete, please call Dr Ronalee Red at [phone].

## 2021-12-07 ENCOUNTER — Other Ambulatory Visit: Payer: Self-pay | Admitting: Pharmacist

## 2021-12-08 ENCOUNTER — Other Ambulatory Visit (HOSPITAL_COMMUNITY): Payer: Self-pay

## 2021-12-08 MED ORDER — CARESTART COVID-19 HOME TEST VI KIT
PACK | 0 refills | Status: DC
  Filled 2021-12-08: qty 4, 4d supply, fill #0

## 2021-12-21 ENCOUNTER — Other Ambulatory Visit (HOSPITAL_COMMUNITY): Payer: Self-pay

## 2021-12-26 ENCOUNTER — Other Ambulatory Visit: Payer: No Typology Code available for payment source

## 2021-12-30 ENCOUNTER — Encounter: Payer: Self-pay | Admitting: Family Medicine

## 2022-01-05 ENCOUNTER — Other Ambulatory Visit (HOSPITAL_COMMUNITY): Payer: Self-pay

## 2022-01-05 ENCOUNTER — Ambulatory Visit (INDEPENDENT_AMBULATORY_CARE_PROVIDER_SITE_OTHER): Payer: No Typology Code available for payment source | Admitting: Family Medicine

## 2022-01-05 ENCOUNTER — Encounter: Payer: Self-pay | Admitting: Family Medicine

## 2022-01-05 VITALS — BP 130/84 | HR 70 | Temp 98.3°F | Resp 16 | Wt 258.0 lb

## 2022-01-05 DIAGNOSIS — G509 Disorder of trigeminal nerve, unspecified: Secondary | ICD-10-CM | POA: Diagnosis not present

## 2022-01-05 MED ORDER — PREDNISONE 10 MG PO TABS
ORAL_TABLET | ORAL | 0 refills | Status: AC
  Filled 2022-01-05: qty 18, 9d supply, fill #0

## 2022-01-05 NOTE — Progress Notes (Signed)
? ?  Subjective:  ? ? Patient ID: Emily Craig, female    DOB: 1971-01-14, 51 y.o.   MRN: [id number] ? ?HPI ?Ear and face pain- sxs started 11 days ago w/ 'a weird sensation on the side of my face' (L side).  Noted that L ear felt full.  Pain eases w/ tylenol/advil but doesn't resolve.  Pt not able to sleep on L side, has pain w/ chewing.  Face is sensitive to touch.  Heat helps the pain.  Had ear infxn in December.  Got COVID late January and isn't sure if this is related.   ? ? ?Review of Systems ?For ROS see HPI  ? ?This visit occurred during the SARS-CoV-2 public health emergency.  Safety protocols were in place, including screening questions prior to the visit, additional usage of staff PPE, and extensive cleaning of exam room while observing appropriate contact time as indicated for disinfecting solutions.   ?   ?Objective:  ? Physical Exam ?Vitals reviewed.  ?Constitutional:   ?   General: She is not in acute distress. ?   Appearance: Normal appearance. She is obese. She is not ill-appearing.  ?HENT:  ?   Head: Normocephalic and atraumatic.  ?   Right Ear: Tympanic membrane, ear canal and external ear normal.  ?   Left Ear: Tympanic membrane, ear canal and external ear normal.  ?   Nose:  ?   Right Sinus: Maxillary sinus tenderness present. No frontal sinus tenderness.  ?   Left Sinus: Maxillary sinus tenderness present. No frontal sinus tenderness.  ?Eyes:  ?   Extraocular Movements: Extraocular movements intact.  ?   Conjunctiva/sclera: Conjunctivae normal.  ?   Pupils: Pupils are equal, round, and reactive to light.  ?Musculoskeletal:     ?   General: Tenderness (TTP over L cheek extending towards ear but not over L temple or forehead or ear) present.  ?   Cervical back: Normal range of motion and neck supple. No rigidity.  ?Lymphadenopathy:  ?   Cervical: No cervical adenopathy.  ?Skin: ?   General: Skin is warm and dry.  ?   Findings: No lesion or rash.  ?Neurological:  ?   General: No focal deficit  present.  ?   Mental Status: She is alert and oriented to person, place, and time.  ?   Cranial Nerves: No cranial nerve deficit.  ?   Sensory: No sensory deficit.  ?   Motor: No weakness.  ?Psychiatric:     ?   Mood and Affect: Mood normal.     ?   Behavior: Behavior normal.     ?   Thought Content: Thought content normal.  ? ? ? ? ? ?   ?Assessment & Plan:  ? ?D/O of L trigeminal nerve- new.  Pt w/ 11 days of sxs.  No concerning exam findings for temporal arteritis.  + TTP but pain is constant and not episodic like Trigeminal Neuralgia.  Most consistent w/ trigeminal neuropathy.  Will start Prednisone to try and improve pain and inflammation.  Refer to Neuro for complete evaluation.  Pt expressed understanding and is in agreement w/ plan.  ?

## 2022-01-05 NOTE — Patient Instructions (Signed)
Follow up as needed or as scheduled ?START the Prednisone as directed- 3 tabs at the same time x3 days, and then 2 tabs, etc.  Take w/ food ?We'll call you with the Neuro appt regarding the nerve pain ?Continue to heat or ice- whichever feels better ?HOLD on ibuprofen or other NSAIDs while on Prednisone but you can use Tylenol as needed ?Call with any questions or concerns ?Hang in there!!! ?

## 2022-01-05 NOTE — Progress Notes (Signed)
e a

## 2022-01-05 NOTE — Telephone Encounter (Signed)
Patient has an appt today.

## 2022-01-11 ENCOUNTER — Other Ambulatory Visit: Payer: Self-pay

## 2022-01-11 ENCOUNTER — Encounter: Payer: Self-pay | Admitting: Neurology

## 2022-01-11 ENCOUNTER — Ambulatory Visit: Payer: No Typology Code available for payment source | Admitting: Neurology

## 2022-01-11 VITALS — BP 131/79 | HR 65 | Ht 66.0 in | Wt 260.4 lb

## 2022-01-11 DIAGNOSIS — G509 Disorder of trigeminal nerve, unspecified: Secondary | ICD-10-CM

## 2022-01-11 DIAGNOSIS — R519 Headache, unspecified: Secondary | ICD-10-CM

## 2022-01-11 DIAGNOSIS — R200 Anesthesia of skin: Secondary | ICD-10-CM

## 2022-01-11 DIAGNOSIS — H9192 Unspecified hearing loss, left ear: Secondary | ICD-10-CM | POA: Diagnosis not present

## 2022-01-11 DIAGNOSIS — R220 Localized swelling, mass and lump, head: Secondary | ICD-10-CM | POA: Diagnosis not present

## 2022-01-11 NOTE — Patient Instructions (Signed)
MRI of the brain w/wo contrast 

## 2022-01-11 NOTE — Progress Notes (Signed)
[facility]    Provider:  Dr Jaynee Eagles Requesting Provider: Midge Minium, MD Primary Care Provider:  Midge Minium, MD  CC:  left facial pain  HPI:  Emily Craig is a 51 y.o. female here as requested by Midge Minium, MD for left trigeminal nerve disorder. PMHx hypothyroidism, severe obesity, adjustment disorder with depressed mood, acute low back pain with radicular symptoms, arthritis, hypertension.  I reviewed Dr. Virgil Benedict note, on January 05, 2022 patient reported ear and face pain starting 11 days prior with a strange sensation on the side of her face (left side), noted that left ear felt full, pain eases with Tylenol pill but does not resolve, not able to sleep on the left side, has pain with chewing, face is sensitive to touch, heat helps the pain, had an ear infection in December and also had COVID in January unsure if it is related.  There were no concerning exam findings for temporal arteritis, she did have plus TTP but pain is constant and not episodic like trigeminal neuralgia, most consistent with trigeminal neuropathy, she was started on prednisone to try and improve pain and inflammation, refer to neuro for complete evaluation.  In October she started noticing distinct change in the left ear for hearing, she saw Dr. Birdie Riddle and her eardrum looked abnormal, the hearing did not get better with flonase, in December she was sick and went to urgent care and they gave her an antibiotic for bulging left ear disk, then she had covid and a ton of congestion, Still can't hear out of her left ear, she has seen Dr. Redmond Baseman years ago but not recently(reviewed Dr. Redmond Baseman chart from 2020, pyogenic granuloma of the skin), 2 weeks ago she woke up and her skin felt raw and burning on the left and it has been constant since then, she has pain in the left ear more tenderness, her mask cannot touch the skin, she can't sleep on it, continuous pain not shooting, more sensitive  in v2/v3. She has been on steroids and is less sensitive to touch. Also having neck pain on the left side. No other focal neurologic deficits, associated symptoms, inciting events or modifiable factors.  Reviewed notes, labs and imaging from outside physicians, which showed:  09/2021: bmp, tsh, b12 normal. Hgba1c 6.1  CT head 2016(reviewed images and agree) COMPARISON:  None.   FINDINGS: The ventricles are normal in size and configuration. There is no intracranial mass, hemorrhage, extra-axial fluid collection, or midline shift. The gray-white compartments are normal. There is no evident acute infarct. The bony calvarium appears intact. The mastoid air cells are clear. There is a small retention cyst in the medial left maxillary antrum.   IMPRESSION: Small retention cyst in the left maxillary antrum medially. No intracranial mass, hemorrhage, or focal gray -white compartment lesions/acute appearing infarct.  Review of Systems: Patient complains of symptoms per HPI as well as the following symptoms facial pain, ear pain. Pertinent negatives and positives per HPI. All others negative.   Social History   Socioeconomic History   Marital status: Married    Spouse name: [name]   Number of children: 3   Years of education: RN   Highest education level: Not on file  Occupational History   Occupation: Programmer, multimedia: [employer]    Comment: [employer]  Tobacco Use   Smoking status: Never   Smokeless tobacco: Never  Vaping Use   Vaping Use: Never used  Substance and Sexual Activity  Alcohol use: Yes    Alcohol/week: 0.0 standard drinks    Comment: rarely   Drug use: No   Sexual activity: Not on file  Other Topics Concern   Not on file  Social History Narrative   Lives with her husband and their daughter.  Their sons (twins) stayed in [place] when they moved here June 2014.   Social Determinants of Health   Financial Resource Strain: Not on file  Food Insecurity:  Not on file  Transportation Needs: Not on file  Physical Activity: Not on file  Stress: Not on file  Social Connections: Not on file  Intimate Partner Violence: Not on file    Family History  Problem Relation Age of Onset   Hypertension Mother    Aortic aneurysm Mother    Cancer Mother        pancreatic cancer   Pancreatic cancer Mother    Heart disease Father    Hyperlipidemia Brother    Hypertension Brother    Diabetes Maternal Grandmother    Heart disease Maternal Grandmother    Cancer Maternal Grandmother        reproductive    Liver disease Maternal Grandfather    Stroke Paternal Grandmother    Breast cancer Cousin 86       paternal side    Colon cancer Neg Hx    Esophageal cancer Neg Hx    Stomach cancer Neg Hx    Rectal cancer Neg Hx     Past Medical History:  Diagnosis Date   Arthritis    hands   Hypertension    Thyroid disease     Patient Active Problem List   Diagnosis Date Noted   Adjustment disorder with depressed mood 09/14/2021   Vitamin D deficiency 07/28/2020   Abdominal cramping 10/11/2018   Physical exam 02/14/2017   Nonallopathic lesion of lumbosacral region 01/16/2017   Nonallopathic lesion of sacral region 01/16/2017   Nonallopathic lesion of thoracic region 01/16/2017   Acute low back pain with radicular symptoms, duration less than 6 weeks 12/25/2016   Severe obesity (BMI >= 40) ([place]) 08/18/2016   Dermatitis 08/05/2015   Reflux 07/05/2015   Hypothyroidism 12/01/2013    Past Surgical History:  Procedure Laterality Date   CHOLECYSTECTOMY     TONSILLECTOMY AND ADENOIDECTOMY      Current Outpatient Medications  Medication Sig Dispense Refill   cetirizine (ZYRTEC) 10 MG tablet Take 10 mg by mouth daily.     COVID-19 At Home Antigen Test [facility] COVID-19 HOME TEST) KIT Use as directed 4 each 0   gabapentin (NEURONTIN) 100 MG capsule Take 3 capsules (300 mg total) by mouth at bedtime. 180 capsule 2   levothyroxine (SYNTHROID) 175  MCG tablet Take 1 tablet (175 mcg total) by mouth daily before breakfast. 90 tablet 1   omeprazole (PRILOSEC) 20 MG capsule Take 20 mg by mouth daily.     predniSONE (DELTASONE) 10 MG tablet 3 tablets (30 mg total) daily for 3 days, THEN 2 tablets (20 mg total) daily for 3 days, THEN 1 tablet (10 mg total) daily for 3 days. Take all doses with food. 18 tablet 0   Semaglutide, 1 MG/DOSE, (OZEMPIC, 1 MG/DOSE,) 4 MG/3ML SOPN Inject 1 mg into the skin once a week. 9 mL 1   spironolactone (ALDACTONE) 50 MG tablet Take 1 tablet (50 mg total) by mouth daily. 90 tablet 1   No current facility-administered medications for this visit.    Allergies as of 01/11/2022 -  Review Complete 01/11/2022  Allergen Reaction Noted   Maxzide [triamterene-hctz] Swelling 06/14/2018   Penicillins  12/01/2013   Shrimp extract allergy skin test  09/21/2020   Sulfa antibiotics Rash 12/01/2013    Vitals: BP 131/79    Pulse 65    Ht _0  (1.676 m)    Wt 260 lb 6.4 oz (118.1 kg)    BMI 42.03 kg/m  Last Weight:  Wt Readings from Last 1 Encounters:  01/11/22 260 lb 6.4 oz (118.1 kg)   Last Height:   Ht Readings from Last 1 Encounters:  01/11/22 _1  (1.676 m)     Physical exam: Exam: Gen: NAD, conversant, well nourised, obese, well groomed                     CV: RRR, no MRG. No Carotid Bruits. No peripheral edema, warm, nontender Eyes: Conjunctivae clear without exudates or hemorrhage Ears: TMs clear  Neuro: Detailed Neurologic Exam  Speech:    Speech is normal; fluent and spontaneous with normal comprehension.  Cognition:    The patient is oriented to person, place, and time;     recent and remote memory intact;     language fluent;     normal attention, concentration,     fund of knowledge Cranial Nerves:    The pupils are equal, round, and reactive to light. The fundi are flat. Visual fields are full to finger confrontation. Extraocular movements are intact. Left sided facial sensory changes and  slight swelling in v2/v3 distribution. The face is symmetric. The palate elevates in the midline. Hearing intact to voice but subjective hearing loss left ear. Voice is normal. Shoulder shrug is normal. The tongue has normal motion without fasciculations.   Coordination:    Normal   Gait:   normal.   Motor Observation:    No asymmetry, no atrophy, and no involuntary movements noted. Tone:    Normal muscle tone.    Posture:    Posture is normal. normal erect    Strength:    Strength is V/V in the upper and lower limbs.      Sensation: intact to LT     Reflex Exam:  DTR's:    Deep tendon reflexes in the upper and lower extremities are normal bilaterally.   Toes:    The toes are downgoing bilaterally.   Clonus:    Clonus is absent.    Assessment/Plan: Is a 51 year old with acute onset hearing loss in the setting of ear pain, possibly abnormal ear exam and/or infection, which has then progressed to left-sided facial sensory changes and pain in the trigeminal V2 V3 distribution.  Her TMs look normal today to me, she still has hearing loss in the left ear.  I think we need an MRI of the brain with thin cuts through the internal auditory canals to evaluate for any pathology such as schwannoma or other lesions/masses that could be causing ear pain and hearing loss.  The trigeminal nerve pain/neuropathy started after the other symptoms so I suspect the trigeminal neuropathy is secondary to what ever process is making her ear hurt and causing hearing loss and discomfort on the left side of her face.    Thin cuts through IAC, left hearing loss, ear pain and trigeminal neuropathy, MRI of the brain w/wo  Orders Placed This Encounter  Procedures   MR BRAIN W WO CONTRAST   No orders of the defined types were placed in this encounter.   Cc:  Midge Minium, MD,  Midge Minium, MD  Sarina Ill, MD  [facility] [address] [place], [place]  [address]  Phone [phone] Fax [phone]

## 2022-01-12 ENCOUNTER — Encounter: Payer: Self-pay | Admitting: Neurology

## 2022-01-19 ENCOUNTER — Telehealth: Payer: Self-pay | Admitting: Neurology

## 2022-01-19 NOTE — Telephone Encounter (Signed)
MR Brain w/wo contrast Dr. Lianne Bushy [facility] Emily Craig: [id number] (exp. 01/24/22 to 02/23/22). Patient is scheduled at [facility] for 01/31/22.  ?

## 2022-01-31 ENCOUNTER — Ambulatory Visit: Payer: No Typology Code available for payment source

## 2022-01-31 DIAGNOSIS — R220 Localized swelling, mass and lump, head: Secondary | ICD-10-CM | POA: Diagnosis not present

## 2022-01-31 DIAGNOSIS — R200 Anesthesia of skin: Secondary | ICD-10-CM

## 2022-01-31 DIAGNOSIS — G509 Disorder of trigeminal nerve, unspecified: Secondary | ICD-10-CM

## 2022-01-31 DIAGNOSIS — R519 Headache, unspecified: Secondary | ICD-10-CM

## 2022-01-31 DIAGNOSIS — H9192 Unspecified hearing loss, left ear: Secondary | ICD-10-CM

## 2022-01-31 MED ORDER — GADOBENATE DIMEGLUMINE 529 MG/ML IV SOLN
20.0000 mL | Freq: Once | INTRAVENOUS | Status: AC | PRN
  Administered 2022-01-31: 20 mL via INTRAVENOUS

## 2022-02-02 ENCOUNTER — Encounter: Payer: Self-pay | Admitting: Neurology

## 2022-02-03 ENCOUNTER — Encounter: Payer: Self-pay | Admitting: Family Medicine

## 2022-02-09 ENCOUNTER — Other Ambulatory Visit: Payer: Self-pay | Admitting: Neurology

## 2022-02-09 ENCOUNTER — Telehealth: Payer: Self-pay | Admitting: Neurology

## 2022-02-09 DIAGNOSIS — M2740 Unspecified cyst of jaw: Secondary | ICD-10-CM

## 2022-02-09 NOTE — Telephone Encounter (Signed)
Referral sent to [facility] [phone]. ?

## 2022-02-17 ENCOUNTER — Encounter: Payer: Self-pay | Admitting: Neurology

## 2022-02-20 ENCOUNTER — Other Ambulatory Visit (HOSPITAL_COMMUNITY): Payer: Self-pay

## 2022-02-20 MED ORDER — WEGOVY 1.7 MG/0.75ML ~~LOC~~ SOAJ
1.7000 mg | SUBCUTANEOUS | 3 refills | Status: DC
Start: 2022-02-20 — End: 2023-01-19
  Filled 2022-02-20 – 2022-03-06 (×4): qty 3, 28d supply, fill #0

## 2022-02-23 ENCOUNTER — Other Ambulatory Visit (HOSPITAL_COMMUNITY): Payer: Self-pay

## 2022-02-23 MED ORDER — METHYLPREDNISOLONE 4 MG PO TBPK
ORAL_TABLET | ORAL | 0 refills | Status: DC
  Filled 2022-02-23 (×2): qty 21, 6d supply, fill #0

## 2022-02-23 MED ORDER — CYCLOBENZAPRINE HCL 10 MG PO TABS
10.0000 mg | ORAL_TABLET | Freq: Every day | ORAL | 0 refills | Status: DC
  Filled 2022-02-23: qty 30, 30d supply, fill #0

## 2022-03-06 ENCOUNTER — Other Ambulatory Visit (HOSPITAL_COMMUNITY): Payer: Self-pay

## 2022-03-16 ENCOUNTER — Other Ambulatory Visit (HOSPITAL_COMMUNITY): Payer: Self-pay

## 2022-03-21 MED ORDER — WEGOVY 0.5 MG/0.5ML ~~LOC~~ SOAJ
0.5000 mg | SUBCUTANEOUS | 1 refills | Status: DC
  Filled 2022-03-21: qty 2, 28d supply, fill #0

## 2022-03-21 NOTE — Addendum Note (Signed)
Addended by: Midge Minium on: 03/21/2022 06:00 PM ? ? Modules accepted: Orders ? ?

## 2022-03-22 ENCOUNTER — Other Ambulatory Visit: Payer: Self-pay | Admitting: Family Medicine

## 2022-03-22 ENCOUNTER — Other Ambulatory Visit (HOSPITAL_COMMUNITY): Payer: Self-pay

## 2022-03-22 MED ORDER — WEGOVY 1 MG/0.5ML ~~LOC~~ SOAJ
1.0000 mg | SUBCUTANEOUS | 1 refills | Status: DC
Start: 2022-03-22 — End: 2023-01-19
  Filled 2022-03-22: qty 2, 28d supply, fill #0

## 2022-03-22 NOTE — Progress Notes (Signed)
Since the 0.'5mg'$  is on back order, I sent in the '1mg'$  dose.  She can start by taking a half dose (0.'5mg'$ ) for 2 weeks and if she is doing well and not having too much nausea, can increase to '1mg'$  weekly. ?

## 2022-03-23 ENCOUNTER — Other Ambulatory Visit (HOSPITAL_COMMUNITY): Payer: Self-pay

## 2022-03-23 NOTE — Progress Notes (Signed)
Attempted to call patient about her medication. LVM to return call or to take a look at [employer]

## 2022-03-28 ENCOUNTER — Other Ambulatory Visit (HOSPITAL_COMMUNITY): Payer: Self-pay

## 2022-04-13 ENCOUNTER — Other Ambulatory Visit (HOSPITAL_COMMUNITY): Payer: Self-pay

## 2022-04-21 ENCOUNTER — Other Ambulatory Visit (HOSPITAL_COMMUNITY): Payer: Self-pay

## 2022-05-01 ENCOUNTER — Other Ambulatory Visit (HOSPITAL_COMMUNITY): Payer: Self-pay

## 2022-05-03 ENCOUNTER — Telehealth: Payer: Self-pay | Admitting: Internal Medicine

## 2022-05-03 DIAGNOSIS — R5383 Other fatigue: Secondary | ICD-10-CM

## 2022-05-03 NOTE — Telephone Encounter (Signed)
Patient called in complaining of fatigue   WIll recomm  checking TSH

## 2022-05-03 NOTE — Addendum Note (Signed)
Addended by: Stephani Police on: 05/03/2022 04:42 PM   Modules accepted: Orders

## 2022-05-03 NOTE — Telephone Encounter (Signed)
TSH ordered per Dr Harrington Challenger request.

## 2022-05-04 ENCOUNTER — Other Ambulatory Visit: Payer: No Typology Code available for payment source

## 2022-05-04 DIAGNOSIS — R5383 Other fatigue: Secondary | ICD-10-CM

## 2022-05-04 LAB — TSH: TSH: 3.1 u[IU]/mL (ref 0.450–4.500)

## 2022-05-17 ENCOUNTER — Other Ambulatory Visit: Payer: Self-pay

## 2022-05-17 ENCOUNTER — Encounter: Payer: Self-pay | Admitting: Family Medicine

## 2022-05-17 MED ORDER — LEVOTHYROXINE SODIUM 175 MCG PO TABS: 175.0000 ug | ORAL_TABLET | Freq: Every day | ORAL | 1 refills | Status: DC

## 2022-06-12 ENCOUNTER — Other Ambulatory Visit (HOSPITAL_COMMUNITY): Payer: Self-pay

## 2022-06-12 ENCOUNTER — Other Ambulatory Visit: Payer: Self-pay | Admitting: Family Medicine

## 2022-06-12 MED ORDER — SPIRONOLACTONE 50 MG PO TABS
50.0000 mg | ORAL_TABLET | Freq: Every day | ORAL | 1 refills | Status: DC
  Filled 2022-06-12 – 2022-06-20 (×2): qty 90, 90d supply, fill #0
  Filled 2022-09-26: qty 90, 90d supply, fill #1

## 2022-06-20 ENCOUNTER — Other Ambulatory Visit (HOSPITAL_COMMUNITY): Payer: Self-pay

## 2022-07-07 ENCOUNTER — Other Ambulatory Visit (HOSPITAL_COMMUNITY): Payer: Self-pay

## 2022-08-23 NOTE — Progress Notes (Signed)
[place] Phone: [phone] Subjective:   Emily Craig, am serving as a scribe for Dr. Hulan Saas.  I'm seeing this patient by the request  of:  Midge Minium, MD  CC: Left hip and back pain but now new onset of neck pain  WYS:[id number]  11/15/2021 Patient does have left greater than right facet arthropathy with possible nerve root impingement of the L4 and the L5 nerve root.  At this point I would like to try the possibility of the facet injections and see how patient responds to it.  My hope is that this does make a significant improvement.  Discussed posture and ergonomics.  Discussed which activities to do which wants to avoid.  Increase activity slowly.  After the injections patient will come back.  Patient does not make a significant improvement I do feel that the possibility of si joint injection on the left side over the possibility of gluteal tendon injections with PRP could be beneficial.  Total time discussing patient's imaging, reviewing patient's imaging before the patient as well as discussing different treatment options and medications greater than 33 minutes  Updated 08/25/2022 Emily Craig is a 51 y.o. female coming in with complaint of L hip and back pain. Patient states that she did not get epidural earlier this year. Patient feels looseness in vertebrae. Hip pain is intermittent.   Today, states that she is having cervical spine pain. Patient notes pain over L trap with rotation and at base of skull. Patient seen by neurosurgery as she had some jaw and ear pain on same side. Also saw dentist and ENT. Notes more popping in neck. Craig radicular symptoms distally.        Past Medical History:  Diagnosis Date   Arthritis    hands   Hypertension    Thyroid disease    Past Surgical History:  Procedure Laterality Date   CHOLECYSTECTOMY     TONSILLECTOMY AND ADENOIDECTOMY     Social  History   Socioeconomic History   Marital status: Married    Spouse name: [name]   Number of children: 3   Years of education: RN   Highest education level: Not on file  Occupational History   Occupation: Programmer, multimedia: [place]    Comment: [employer]  Tobacco Use   Smoking status: Never   Smokeless tobacco: Never  Vaping Use   Vaping Use: Never used  Substance and Sexual Activity   Alcohol use: Yes    Alcohol/week: 0.0 standard drinks of alcohol    Comment: rarely   Drug use: Craig   Sexual activity: Not on file  Other Topics Concern   Not on file  Social History Narrative   Lives with her husband and their daughter.  Their sons (twins) stayed in [place] when they moved here June 2014.   Social Determinants of Health   Financial Resource Strain: Not on file  Food Insecurity: Not on file  Transportation Needs: Not on file  Physical Activity: Not on file  Stress: Not on file  Social Connections: Not on file   Allergies  Allergen Reactions   Maxzide [Triamterene-Hctz] Swelling   Penicillins     Allergy since childhood/unknown reaction   Shrimp Extract Allergy Skin Test     Eats shrimp all the time per pt   Sulfa Antibiotics Rash   Family History  Problem Relation Age of Onset   Hypertension  Mother    Aortic aneurysm Mother    Cancer Mother        pancreatic cancer   Pancreatic cancer Mother    Heart disease Father    Hyperlipidemia Brother    Hypertension Brother    Diabetes Maternal Grandmother    Heart disease Maternal Grandmother    Cancer Maternal Grandmother        reproductive    Liver disease Maternal Grandfather    Stroke Paternal Grandmother    Breast cancer Cousin 102       paternal side    Colon cancer Neg Hx    Esophageal cancer Neg Hx    Stomach cancer Neg Hx    Rectal cancer Neg Hx     Current Outpatient Medications (Endocrine & Metabolic):    levothyroxine (SYNTHROID) 175 MCG tablet, Take 1 tablet (175 mcg total) by mouth daily  before breakfast.   methylPREDNISolone (MEDROL DOSEPAK) 4 MG TBPK tablet, Take as directed per package.   predniSONE (DELTASONE) 20 MG tablet, Take 2 tablets (40 mg total) by mouth daily with breakfast.  Current Outpatient Medications (Cardiovascular):    spironolactone (ALDACTONE) 50 MG tablet, Take 1 tablet (50 mg total) by mouth daily.  Current Outpatient Medications (Respiratory):    cetirizine (ZYRTEC) 10 MG tablet, Take 10 mg by mouth daily.    Current Outpatient Medications (Other):    COVID-19 At Home Antigen Test [facility] COVID-19 HOME TEST) KIT, Use as directed   cyclobenzaprine (FLEXERIL) 10 MG tablet, Take 1 tablet (10 mg total) by mouth at bedtime for facial muscle spasms   gabapentin (NEURONTIN) 100 MG capsule, Take 2 capsules (200 mg total) by mouth 3 (three) times daily.   omeprazole (PRILOSEC) 20 MG capsule, Take 20 mg by mouth daily.   Semaglutide-Weight Management (WEGOVY) 1 MG/0.5ML SOAJ, Inject 1 mg into the skin once a week.   Semaglutide-Weight Management (WEGOVY) 1.7 MG/0.75ML SOAJ, Inject 1.7 mg into the skin once a week.   Reviewed prior external information including notes and imaging from  primary care provider As well as notes that were available from care everywhere and other healthcare systems.  Past medical history, social, surgical and family history all reviewed in electronic medical record.  Craig pertanent information unless stated regarding to the chief complaint.   Review of Systems:  Craig headache, visual changes, nausea, vomiting, diarrhea, constipation, dizziness, abdominal pain, skin rash, fevers, chills, night sweats, weight loss, swollen lymph nodes, body aches, joint swelling, chest pain, shortness of breath, mood changes. POSITIVE muscle aches  Objective  Blood pressure 124/88, pulse 72, height _0  (1.676 m), weight 281 lb (127.5 kg), SpO2 98 %.   General: Craig apparent distress alert and oriented x3 mood and affect normal, dressed  appropriately.  HEENT: Pupils equal, extraocular movements intact  Respiratory: Patient's speak in full sentences and does not appear short of breath  Cardiovascular: Craig lower extremity edema, non tender, Craig erythema  Severe limited range of motion of the neck noted.  Some crepitus noted as well.  Patient has a severe trigger points noted in the left trapezius area.  Negative Spurling's but does lack the last 10 degrees of extension of the neck.  After verbal consent patient was prepped with alcohol swab and with a 25-gauge half inch needle injected in 4 distinct trigger points in the trapezius, rhomboids, and latissimus dorsi muscle.  A total of 4 cc of 0.5% Marcaine and 1 cc of Kenalog 40 mg/mL.  Craig blood loss.  Band-Aid placed.  Postinjection instructions given.   [id number]; 15 additional minutes spent for Therapeutic exercises as stated in above notes.  This included exercises focusing on stretching, strengthening, with significant focus on eccentric aspects.   Long term goals include an improvement in range of motion, strength, endurance as well as avoiding reinjury. Patient's frequency would include in 1-2 times a day, 3-5 times a week for a duration of 6-12 weeks. Shoulder Exercises that included:  Basic scapular stabilization to include adduction and depression of scapula Scaption, focusing on proper movement and good control Internal and External rotation utilizing a theraband, with elbow tucked at side entire time Rows with theraband   Proper technique shown and discussed handout in great detail with ATC.  All questions were discussed and answered.     Impression and Recommendations:    The above documentation has been reviewed and is accurate and complete Lyndal Pulley, DO

## 2022-08-24 ENCOUNTER — Encounter: Payer: Self-pay | Admitting: Family Medicine

## 2022-08-25 ENCOUNTER — Other Ambulatory Visit (HOSPITAL_COMMUNITY): Payer: Self-pay

## 2022-08-25 ENCOUNTER — Ambulatory Visit (INDEPENDENT_AMBULATORY_CARE_PROVIDER_SITE_OTHER): Payer: No Typology Code available for payment source

## 2022-08-25 ENCOUNTER — Ambulatory Visit: Payer: No Typology Code available for payment source | Admitting: Family Medicine

## 2022-08-25 VITALS — BP 124/88 | HR 72 | Ht 66.0 in | Wt 281.0 lb

## 2022-08-25 DIAGNOSIS — M25512 Pain in left shoulder: Secondary | ICD-10-CM | POA: Diagnosis not present

## 2022-08-25 DIAGNOSIS — M542 Cervicalgia: Secondary | ICD-10-CM | POA: Diagnosis not present

## 2022-08-25 MED ORDER — PREDNISONE 20 MG PO TABS
40.0000 mg | ORAL_TABLET | Freq: Every day | ORAL | 0 refills | Status: DC
  Filled 2022-08-25: qty 10, 5d supply, fill #0

## 2022-08-25 MED ORDER — GABAPENTIN 100 MG PO CAPS
200.0000 mg | ORAL_CAPSULE | Freq: Three times a day (TID) | ORAL | 0 refills | Status: DC
Start: 2022-08-25 — End: 2022-10-06
  Filled 2022-08-25: qty 180, 30d supply, fill #0

## 2022-08-25 NOTE — Assessment & Plan Note (Signed)
Patient given injection and tolerated the procedure well.  Discussed icing regimen and home exercises, which activities to the importance to avoid.  Discussed with patient I do think that there is a possibility of cervical radiculopathy and started gabapentin 200 mg 3 times a day.  X-rays are pending and I do think that this will help with further evaluation for how much of the underlying arthritic changes could be contributing as well.  Follow-up with me again in 6 to 8 weeks.  Exercises given as well by athletic trainer

## 2022-08-25 NOTE — Patient Instructions (Addendum)
Trigger point injections today Xray on your way out Gabapentin '200mg'$  3x a day Prednsione '40mg'$  for 5 days See me in 5-6 weeks

## 2022-08-29 ENCOUNTER — Other Ambulatory Visit: Payer: Self-pay | Admitting: Family Medicine

## 2022-08-29 DIAGNOSIS — Z1231 Encounter for screening mammogram for malignant neoplasm of breast: Secondary | ICD-10-CM

## 2022-09-26 ENCOUNTER — Other Ambulatory Visit (HOSPITAL_COMMUNITY): Payer: Self-pay

## 2022-10-05 NOTE — Progress Notes (Signed)
[place] Phone: [phone] Subjective:   [name], am serving as a scribe for Dr. Hulan Saas.  I'm seeing this patient by the request  of:  Midge Minium, MD  CC: Neck and back pain follow-up  WTU:[id number]  08/25/2022 Patient given injection and tolerated the procedure well.  Discussed icing regimen and home exercises, which activities to the importance to avoid.  Discussed with patient I do think that there is a possibility of cervical radiculopathy and started gabapentin 200 mg 3 times a day.  X-rays are pending and I do think that this will help with further evaluation for how much of the underlying arthritic changes could be contributing as well.  Follow-up with me again in 6 to 8 weeks.  Exercises given as well by athletic trainer      Update 10/06/2022 Lynisha Osuch is a 51 y.o. female coming in with complaint of L shoulder pain.  Was found to have more of trigger points noted of the left shoulder.  Given trigger point injections and home exercises.  Medications patient was to be using included the Flexeril, and gabapentin 200 mg at night.  Patient states that her has improvement following injection. Has stiffness in the morning and it hurts to lie on that side. Understands that she has to make postural modifications at work. Trap is tighter on the L side with rotation. Tried to take gabapentin 2x a day but is only taking it at night recently. She was getting relief when using 3x a day.   Xray 08/25/2022 IMPRESSION: 1. No evidence of acute abnormality. 2. Mild to moderate multilevel degenerative disc disease/spondylosis and facet arthropathy throughout the cervical spine.       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  Their sons (twins) stayed in [place] when they moved here June 2014.   Social Determinants of Health   Financial Resource Strain: Not on file  Food Insecurity: Not on file  Transportation Needs: Not on file  Physical Activity: Not on file  Stress: Not on file  Social Connections: Not on file   Allergies  Allergen Reactions   Maxzide [Triamterene-Hctz] Swelling   Penicillins     Allergy since childhood/unknown reaction   Shrimp Extract Allergy Skin Test     Eats shrimp all the time per pt   Sulfa Antibiotics Rash   Family History  Problem Relation Age of Onset  Hypertension Mother    Aortic aneurysm Mother    Cancer Mother        pancreatic cancer   Pancreatic cancer Mother    Heart disease Father    Hyperlipidemia Brother    Hypertension Brother    Diabetes Maternal Grandmother    Heart disease Maternal Grandmother    Cancer Maternal Grandmother        reproductive    Liver disease Maternal Grandfather    Stroke Paternal Grandmother    Breast cancer Cousin 34       paternal side    Colon cancer Neg Hx    Esophageal cancer Neg Hx    Stomach cancer Neg Hx    Rectal cancer Neg Hx     Current Outpatient Medications (Endocrine & Metabolic):    levothyroxine (SYNTHROID) 175 MCG tablet, Take 1 tablet (175 mcg total) by  mouth daily before breakfast.   methylPREDNISolone (MEDROL DOSEPAK) 4 MG TBPK tablet, Take as directed per package.   predniSONE (DELTASONE) 20 MG tablet, Take 2 tablets (40 mg total) by mouth daily with breakfast.  Current Outpatient Medications (Cardiovascular):    spironolactone (ALDACTONE) 50 MG tablet, Take 1 tablet (50 mg total) by mouth daily.  Current Outpatient Medications (Respiratory):    cetirizine (ZYRTEC) 10 MG tablet, Take 10 mg by mouth daily.    Current Outpatient Medications (Other):    COVID-19 At Home Antigen Test [facility] COVID-19 HOME TEST) KIT, Use as directed   cyclobenzaprine (FLEXERIL) 10 MG tablet, Take 1 tablet (10 mg total) by mouth at bedtime for facial muscle spasms   gabapentin (NEURONTIN) 100 MG capsule, Take 2 capsules (200 mg total) by mouth 2 (two) times daily.   omeprazole (PRILOSEC) 20 MG capsule, Take 20 mg by mouth daily.   Semaglutide-Weight Management (WEGOVY) 1 MG/0.5ML SOAJ, Inject 1 mg into the skin once a week.   Semaglutide-Weight Management (WEGOVY) 1.7 MG/0.75ML SOAJ, Inject 1.7 mg into the skin once a week.   Reviewed prior external information including notes and imaging from  primary care provider As well as notes that were available from care everywhere and other healthcare systems.  Past medical history, social, surgical and family history all reviewed in electronic medical record.  No pertanent information unless stated regarding to the chief complaint.   Review of Systems:  No headache, visual changes, nausea, vomiting, diarrhea, constipation, dizziness, abdominal pain, skin rash, fevers, chills, night sweats, weight loss, swollen lymph nodes, body aches, joint swelling, chest pain, shortness of breath, mood changes. POSITIVE muscle aches  Objective  Blood pressure 122/86, pulse 72, height _0  (1.676 m), weight 280 lb (127 kg), SpO2 99 %.   General: No apparent distress alert and oriented x3 mood and affect normal, dressed  appropriately.  HEENT: Pupils equal, extraocular movements intact  Respiratory: Patient's speak in full sentences and does not appear short of breath  Cardiovascular: No lower extremity edema, non tender, no erythema   Neck exam does have some loss of lordosis.  Some tenderness to palpation noted as well.  Patient does have some limited sidebending bilaterally.  Some crepitus in the neck noted as well.  Negative Spurling's.  Osteopathic findings C2 flexed rotated and side bent right C4 flexed rotated and side bent left C6 flexed rotated and side bent left T3 extended rotated and side bent right inhaled third rib T9 extended rotated and side bent left L2 flexed rotated and side bent right L5 flexed rotated and side bent left Sacrum right  on right    Impression and Recommendations:    The above documentation has been reviewed and is accurate and complete Lyndal Pulley, DO

## 2022-10-06 ENCOUNTER — Other Ambulatory Visit (HOSPITAL_COMMUNITY): Payer: Self-pay

## 2022-10-06 ENCOUNTER — Ambulatory Visit: Payer: No Typology Code available for payment source | Admitting: Family Medicine

## 2022-10-06 ENCOUNTER — Ambulatory Visit: Payer: Self-pay

## 2022-10-06 VITALS — BP 122/86 | HR 72 | Ht 66.0 in | Wt 280.0 lb

## 2022-10-06 DIAGNOSIS — M9903 Segmental and somatic dysfunction of lumbar region: Secondary | ICD-10-CM | POA: Diagnosis not present

## 2022-10-06 DIAGNOSIS — M9908 Segmental and somatic dysfunction of rib cage: Secondary | ICD-10-CM | POA: Diagnosis not present

## 2022-10-06 DIAGNOSIS — M9904 Segmental and somatic dysfunction of sacral region: Secondary | ICD-10-CM

## 2022-10-06 DIAGNOSIS — M5030 Other cervical disc degeneration, unspecified cervical region: Secondary | ICD-10-CM | POA: Diagnosis not present

## 2022-10-06 DIAGNOSIS — M9902 Segmental and somatic dysfunction of thoracic region: Secondary | ICD-10-CM

## 2022-10-06 DIAGNOSIS — G8929 Other chronic pain: Secondary | ICD-10-CM

## 2022-10-06 DIAGNOSIS — M25512 Pain in left shoulder: Secondary | ICD-10-CM | POA: Diagnosis not present

## 2022-10-06 DIAGNOSIS — M9901 Segmental and somatic dysfunction of cervical region: Secondary | ICD-10-CM | POA: Diagnosis not present

## 2022-10-06 MED ORDER — GABAPENTIN 100 MG PO CAPS
200.0000 mg | ORAL_CAPSULE | Freq: Two times a day (BID) | ORAL | 0 refills | Status: DC
  Filled 2022-10-06 – 2022-12-04 (×3): qty 180, 45d supply, fill #0

## 2022-10-06 NOTE — Patient Instructions (Addendum)
Gabapentin '200mg'$  at night Started manipulation today See me in 5-6 weeks

## 2022-10-06 NOTE — Assessment & Plan Note (Signed)
Arthritic changes noted.  Discussed icing regimen and home exercises, which activities to do and which ones to avoid.  Increase activity slowly.  Follow-up again in 6 to 8 weeks did respond well to osteopathic manipulation.  We discussed if any radicular symptoms or worsening pain we do need to consider the possibility of advanced imaging.  Medications we discussed was increasing gabapentin to 200 mg twice a day.

## 2022-10-18 ENCOUNTER — Other Ambulatory Visit (HOSPITAL_COMMUNITY): Payer: Self-pay

## 2022-10-24 ENCOUNTER — Ambulatory Visit
Admission: RE | Admit: 2022-10-24 | Discharge: 2022-10-24 | Disposition: A | Discharge status: Home / Self Care | Payer: No Typology Code available for payment source | Source: Ambulatory Visit | Attending: Family Medicine | Admitting: Family Medicine

## 2022-10-24 DIAGNOSIS — Z1231 Encounter for screening mammogram for malignant neoplasm of breast: Secondary | ICD-10-CM

## 2022-11-08 NOTE — Progress Notes (Signed)
[place] Phone: [phone] Subjective:   [name], am serving as a scribe for Dr. Hulan Saas.  I'm seeing this patient by the request  of:  Midge Minium, MD  CC: Neck and back pain.  OEV:[id number]  Emily Craig is a 52 y.o. female coming in with complaint of back and neck pain. OMT 10/06/2022. Also f/u for L shoulder pain. Patient states that she is the same as last visit. Using gabapentin during the day when she remembers. Uses salonpas patch as well. Also using manual massaging tool to the traps. Pain is more in L trap than R.   Medications patient has been prescribed: Gabapentin  Taking: Yes         Reviewed prior external information including notes and imaging from previsou exam, outside providers and external EMR if available.   As well as notes that were available from care everywhere and other healthcare systems.  Past medical history, social, surgical and family history all reviewed in electronic medical record.  No pertanent information unless stated regarding to the chief complaint.   Past Medical History:  Diagnosis Date   Arthritis    hands   Hypertension    Thyroid disease     Allergies  Allergen Reactions   Maxzide [Triamterene-Hctz] Swelling   Penicillins     Allergy since childhood/unknown reaction   Shrimp Extract Allergy Skin Test     Eats shrimp all the time per pt   Sulfa Antibiotics Rash     Review of Systems:  No headache, visual changes, nausea, vomiting, diarrhea, constipation, dizziness, abdominal pain, skin rash, fevers, chills, night sweats, weight loss, swollen lymph nodes, body aches, joint swelling, chest pain, shortness of breath, mood changes. POSITIVE muscle aches  Objective  Blood pressure 124/84, pulse 80, height '5\' 6"'$  (1.676 m), SpO2 99 %.   General: No apparent distress alert and oriented x3 mood and affect normal, dressed  appropriately.  HEENT: Pupils equal, extraocular movements intact  Respiratory: Patient's speak in full sentences and does not appear short of breath  Cardiovascular: No lower extremity edema, non tender, no erythema  Low back exam does have some loss of lordosis.  Some tenderness to palpation of the paraspinal musculature.  Tightness with Corky Sox left greater than right.  Patient does on her upper neck does have tenderness seems to be left greater than right.  Osteopathic findings  C3 flexed rotated and side bent left C6 flexed rotated and side bent left T9 extended rotated and side bent left L2 flexed rotated and side bent right Sacrum right on right       Assessment and Plan:  Degenerative cervical disc Continue degenerative disc disease and continued pain.  Start Cymbalta at low-dose and see if this will be beneficial.  We have discussed with patient about icing regimen, home exercises and advancing imaging if this continues to worsen.  Follow-up again in 6 to 8 weeks.    Nonallopathic problems  Decision today to treat with OMT was based on Physical Exam  After verbal consent patient was treated with HVLA, ME, FPR techniques in cervical, thoracic, lumbar, and sacral  areas  Patient tolerated the procedure well with improvement in symptoms  Patient given exercises, stretches and lifestyle modifications  See medications in patient instructions if given  Patient will follow up in 4-8 weeks    The above documentation has been reviewed and is accurate and complete Olevia Bowens  Tamala Julian, DO          Note: This dictation was prepared with Dragon dictation along with smaller phrase technology. Any transcriptional errors that result from this process are unintentional.

## 2022-11-15 ENCOUNTER — Ambulatory Visit: Payer: Self-pay

## 2022-11-15 ENCOUNTER — Ambulatory Visit (INDEPENDENT_AMBULATORY_CARE_PROVIDER_SITE_OTHER): Payer: 59 | Admitting: Family Medicine

## 2022-11-15 ENCOUNTER — Other Ambulatory Visit (HOSPITAL_COMMUNITY): Payer: Self-pay

## 2022-11-15 VITALS — BP 124/84 | HR 80 | Ht 66.0 in

## 2022-11-15 DIAGNOSIS — M9901 Segmental and somatic dysfunction of cervical region: Secondary | ICD-10-CM

## 2022-11-15 DIAGNOSIS — G8929 Other chronic pain: Secondary | ICD-10-CM | POA: Diagnosis not present

## 2022-11-15 DIAGNOSIS — M9902 Segmental and somatic dysfunction of thoracic region: Secondary | ICD-10-CM

## 2022-11-15 DIAGNOSIS — M5030 Other cervical disc degeneration, unspecified cervical region: Secondary | ICD-10-CM

## 2022-11-15 DIAGNOSIS — M9904 Segmental and somatic dysfunction of sacral region: Secondary | ICD-10-CM | POA: Diagnosis not present

## 2022-11-15 DIAGNOSIS — M9903 Segmental and somatic dysfunction of lumbar region: Secondary | ICD-10-CM | POA: Diagnosis not present

## 2022-11-15 DIAGNOSIS — M25512 Pain in left shoulder: Secondary | ICD-10-CM | POA: Diagnosis not present

## 2022-11-15 MED ORDER — DULOXETINE HCL 20 MG PO CPEP
20.0000 mg | ORAL_CAPSULE | Freq: Every day | ORAL | 3 refills | Status: DC
  Filled 2022-11-15: qty 30, 30d supply, fill #0
  Filled 2022-12-15: qty 30, 30d supply, fill #1
  Filled 2023-01-10: qty 30, 30d supply, fill #2
  Filled 2023-02-08: qty 30, 30d supply, fill #3

## 2022-11-15 NOTE — Assessment & Plan Note (Signed)
Continue degenerative disc disease and continued pain.  Start Cymbalta at low-dose and see if this will be beneficial.  We have discussed with patient about icing regimen, home exercises and advancing imaging if this continues to worsen.  Follow-up again in 6 to 8 weeks.

## 2022-11-15 NOTE — Patient Instructions (Signed)
Happy New Year!  I am always here for you  Cymbalta 20 mg daily  See me again in 4 weeks

## 2022-11-16 ENCOUNTER — Other Ambulatory Visit (HOSPITAL_COMMUNITY): Payer: Self-pay

## 2022-11-17 ENCOUNTER — Other Ambulatory Visit (HOSPITAL_COMMUNITY): Payer: Self-pay

## 2022-11-27 ENCOUNTER — Other Ambulatory Visit (HOSPITAL_COMMUNITY): Payer: Self-pay

## 2022-12-04 ENCOUNTER — Other Ambulatory Visit (HOSPITAL_COMMUNITY): Payer: Self-pay

## 2022-12-12 ENCOUNTER — Ambulatory Visit: Payer: 59 | Admitting: Family Medicine

## 2022-12-12 NOTE — Progress Notes (Deleted)
  [place] Phone: [phone] Subjective:    I'm seeing this patient by the request  of:  Midge Minium, MD  CC:   [id number]  Sheresa Cullop is a 52 y.o. female coming in with complaint of back and neck pain. OMT on 11/15/2022. Also seen for shoulder pain. Patient states   Medications patient has been prescribed:   Taking:         Reviewed prior external information including notes and imaging from previsou exam, outside providers and external EMR if available.   As well as notes that were available from care everywhere and other healthcare systems.  Past medical history, social, surgical and family history all reviewed in electronic medical record.  No pertanent information unless stated regarding to the chief complaint.   Past Medical History:  Diagnosis Date   Arthritis    hands   Hypertension    Thyroid disease     Allergies  Allergen Reactions   Maxzide [Triamterene-Hctz] Swelling   Penicillins     Allergy since childhood/unknown reaction   Shrimp Extract Allergy Skin Test     Eats shrimp all the time per pt   Sulfa Antibiotics Rash     Review of Systems:  No headache, visual changes, nausea, vomiting, diarrhea, constipation, dizziness, abdominal pain, skin rash, fevers, chills, night sweats, weight loss, swollen lymph nodes, body aches, joint swelling, chest pain, shortness of breath, mood changes. POSITIVE muscle aches  Objective  There were no vitals taken for this visit.   General: No apparent distress alert and oriented x3 mood and affect normal, dressed appropriately.  HEENT: Pupils equal, extraocular movements intact  Respiratory: Patient's speak in full sentences and does not appear short of breath  Cardiovascular: No lower extremity edema, non tender, no erythema  Gait MSK:  Back   Osteopathic findings  C2 flexed rotated and side bent right C6 flexed rotated  and side bent left T3 extended rotated and side bent right inhaled rib T9 extended rotated and side bent left L2 flexed rotated and side bent right Sacrum right on right       Assessment and Plan:  No problem-specific Assessment & Plan notes found for this encounter.    Nonallopathic problems  Decision today to treat with OMT was based on Physical Exam  After verbal consent patient was treated with HVLA, ME, FPR techniques in cervical, rib, thoracic, lumbar, and sacral  areas  Patient tolerated the procedure well with improvement in symptoms  Patient given exercises, stretches and lifestyle modifications  See medications in patient instructions if given  Patient will follow up in 4-8 weeks             Note: This dictation was prepared with Dragon dictation along with smaller phrase technology. Any transcriptional errors that result from this process are unintentional.

## 2023-01-10 ENCOUNTER — Other Ambulatory Visit: Payer: Self-pay | Admitting: Family Medicine

## 2023-01-10 ENCOUNTER — Other Ambulatory Visit: Payer: Self-pay

## 2023-01-15 ENCOUNTER — Other Ambulatory Visit (HOSPITAL_COMMUNITY): Payer: Self-pay

## 2023-01-17 ENCOUNTER — Other Ambulatory Visit (HOSPITAL_COMMUNITY): Payer: Self-pay

## 2023-01-18 ENCOUNTER — Other Ambulatory Visit (HOSPITAL_COMMUNITY): Payer: Self-pay

## 2023-01-18 ENCOUNTER — Encounter: Payer: Self-pay | Admitting: Family Medicine

## 2023-01-18 ENCOUNTER — Other Ambulatory Visit: Payer: Self-pay

## 2023-01-18 MED ORDER — SPIRONOLACTONE 50 MG PO TABS
50.0000 mg | ORAL_TABLET | Freq: Every day | ORAL | 0 refills | Status: DC
  Filled 2023-01-18: qty 30, 30d supply, fill #0

## 2023-01-18 NOTE — Telephone Encounter (Signed)
Pt has a virtual apt with Dr Birdie Riddle tomorrow at 61 am.

## 2023-01-19 ENCOUNTER — Encounter: Payer: Self-pay | Admitting: Family Medicine

## 2023-01-19 ENCOUNTER — Telehealth (INDEPENDENT_AMBULATORY_CARE_PROVIDER_SITE_OTHER): Payer: 59 | Admitting: Family Medicine

## 2023-01-19 DIAGNOSIS — I10 Essential (primary) hypertension: Secondary | ICD-10-CM | POA: Insufficient documentation

## 2023-01-19 DIAGNOSIS — E039 Hypothyroidism, unspecified: Secondary | ICD-10-CM

## 2023-01-19 NOTE — Progress Notes (Signed)
Virtual Visit via Video   I connected with patient on 01/19/23 at 10:20 AM EDT by a video enabled telemedicine application and verified that I am speaking with the correct person using two identifiers.  Location patient: Home Location provider: [facility] Persons participating in the virtual visit: Patient, Provider, Ripley Marcille Blanco C)  I discussed the limitations of evaluation and management by telemedicine and the availability of in person appointments. The patient expressed understanding and agreed to proceed.  Subjective:   HPI:   Hypothyroid- ongoing issue.  Currently on Levothyroxine 11mcg daily.  Due for labs.  No changes to skin/hair/nails.  Pt reports being frequently cold but 'not freezing' like she was when her 'thyroid was out of whack'.  HTN- ongoing issue.  Currently on Spironolactone 50mg  daily.  Pt reports typical BP's 120-130s/70-80s.  No CP, SOB, HA's, visual changes, edema.  Obesity- ongoing issue for pt.  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  Pt reports her goal is to take better care of herself.    ROS:   See pertinent positives and negatives per HPI.  Patient Active Problem List   Diagnosis Date Noted   Degenerative cervical disc 10/06/2022   Trigger point of left shoulder region 08/25/2022   Adjustment disorder with depressed mood 09/14/2021   Vitamin D deficiency 07/28/2020   Abdominal cramping 10/11/2018   Physical exam 02/14/2017   Nonallopathic lesion of lumbosacral region 01/16/2017   Nonallopathic lesion of sacral region 01/16/2017   Nonallopathic lesion of thoracic region 01/16/2017   Acute low back pain with radicular symptoms, duration less than 6 weeks 12/25/2016   Severe obesity (BMI >= 40) ([facility]) 08/18/2016   Dermatitis 08/05/2015   Reflux 07/05/2015   Hypothyroidism 12/01/2013    Social History   Tobacco Use   Smoking status: Never   Smokeless tobacco: Never  Substance  Use Topics   Alcohol use: Yes    Alcohol/week: 0.0 standard drinks of alcohol    Comment: rarely    Current Outpatient Medications:    cetirizine (ZYRTEC) 10 MG tablet, Take 10 mg by mouth daily., Disp: , Rfl:    DULoxetine (CYMBALTA) 20 MG capsule, Take 1 capsule (20 mg total) by mouth daily., Disp: 30 capsule, Rfl: 3   gabapentin (NEURONTIN) 100 MG capsule, Take 2 capsules (200 mg total) by mouth 2 (two) times daily., Disp: 180 capsule, Rfl: 0   levothyroxine (SYNTHROID) 175 MCG tablet, Take 1 tablet (175 mcg total) by mouth daily before breakfast., Disp: 90 tablet, Rfl: 1   omeprazole (PRILOSEC) 20 MG capsule, Take 20 mg by mouth daily., Disp: , Rfl:    spironolactone (ALDACTONE) 50 MG tablet, Take 1 tablet (50 mg total) by mouth daily., Disp: 30 tablet, Rfl: 0   COVID-19 At Home Antigen Test ([facility]ARE[facility] COVID-19 HOME TEST) KIT, Use as directed (Patient not taking: Reported on 01/19/2023), Disp: 4 each, Rfl: 0  Allergies  Allergen Reactions   Maxzide [Triamterene-Hctz] Swelling   Penicillins     Allergy since childhood/unknown reaction   Shrimp Extract     Eats shrimp all the time per pt   Sulfa Antibiotics Rash    Objective:   There were no vitals taken for this visit. AAOx3, NAD Obese NCAT, EOMI No obvious CN deficits Coloring WNL Pt is able to speak clearly, coherently without shortness of breath or increased work of breathing.  Thought process is linear.  Mood is appropriate.   Assessment and  Plan:   Hypothyroid- ongoing issue for pt.  Currently on Levothyroxine 140mcg daily.  She reports being asymptomatic but does acknowledge that she is colder than most people around her.  Check labs.  Adjust meds prn   HTN- ongoing issue for pt.  She is currently on Spironolactone 25mg  daily.  She does not take her BP at home but when she goes to other appts her BP's are typically 120-130s/70-80s.  Check labs as she is on Spironolactone but no anticipated med changes.  Obesity-  ongoing issue for pt.  She reports feeling much better since starting Cymbalta 20mg  daily and now her goal is to start taking better physical care of herself.  Encouraged healthy diet and regular physical activity.  Will check labs to risk stratify and follow.   [name], MD 01/19/2023

## 2023-02-08 ENCOUNTER — Other Ambulatory Visit (HOSPITAL_COMMUNITY): Payer: Self-pay

## 2023-02-08 ENCOUNTER — Other Ambulatory Visit: Payer: Self-pay | Admitting: Family Medicine

## 2023-02-08 ENCOUNTER — Other Ambulatory Visit: Payer: Self-pay

## 2023-02-08 ENCOUNTER — Encounter: Payer: Self-pay | Admitting: Family Medicine

## 2023-02-08 MED ORDER — LEVOTHYROXINE SODIUM 175 MCG PO TABS
175.0000 ug | ORAL_TABLET | Freq: Every day | ORAL | 1 refills | Status: DC
  Filled 2023-02-08: qty 90, 90d supply, fill #0
  Filled 2023-03-18 – 2023-05-14 (×2): qty 90, 90d supply, fill #1

## 2023-02-08 MED ORDER — GABAPENTIN 100 MG PO CAPS
200.0000 mg | ORAL_CAPSULE | Freq: Two times a day (BID) | ORAL | 0 refills | Status: DC
  Filled 2023-02-08: qty 180, 45d supply, fill #0

## 2023-02-08 MED ORDER — SPIRONOLACTONE 50 MG PO TABS
50.0000 mg | ORAL_TABLET | Freq: Every day | ORAL | 0 refills | Status: DC
  Filled 2023-02-08 – 2023-02-11 (×2): qty 30, 30d supply, fill #0

## 2023-02-08 MED ORDER — DULOXETINE HCL 20 MG PO CPEP
20.0000 mg | ORAL_CAPSULE | Freq: Every day | ORAL | 3 refills | Status: DC
  Filled 2023-02-11: qty 30, 30d supply, fill #0
  Filled 2023-03-18: qty 30, 30d supply, fill #1
  Filled 2023-04-18: qty 30, 30d supply, fill #2
  Filled 2023-05-14: qty 30, 30d supply, fill #3

## 2023-02-08 NOTE — Addendum Note (Signed)
Addended by: Patrcia Dolly on: 02/08/2023 01:13 PM   Modules accepted: Orders

## 2023-02-08 NOTE — Telephone Encounter (Signed)
Pt is asking if she should wait till she has been taking levothyroxine for a few weeks or go ahead and get them drawn?

## 2023-02-12 ENCOUNTER — Other Ambulatory Visit: Payer: Self-pay

## 2023-02-12 ENCOUNTER — Other Ambulatory Visit (HOSPITAL_COMMUNITY): Payer: Self-pay

## 2023-02-14 ENCOUNTER — Other Ambulatory Visit (HOSPITAL_COMMUNITY): Payer: Self-pay

## 2023-03-18 ENCOUNTER — Other Ambulatory Visit: Payer: Self-pay | Admitting: Family Medicine

## 2023-03-19 ENCOUNTER — Other Ambulatory Visit (HOSPITAL_COMMUNITY): Payer: Self-pay

## 2023-03-19 ENCOUNTER — Other Ambulatory Visit: Payer: Self-pay

## 2023-03-19 MED ORDER — SPIRONOLACTONE 50 MG PO TABS
50.0000 mg | ORAL_TABLET | Freq: Every day | ORAL | 0 refills | Status: DC
  Filled 2023-03-19: qty 30, 30d supply, fill #0

## 2023-03-19 MED ORDER — GABAPENTIN 100 MG PO CAPS
200.0000 mg | ORAL_CAPSULE | Freq: Two times a day (BID) | ORAL | 0 refills | Status: DC
  Filled 2023-03-19 – 2023-04-18 (×2): qty 180, 45d supply, fill #0

## 2023-04-18 ENCOUNTER — Other Ambulatory Visit (HOSPITAL_COMMUNITY): Payer: Self-pay

## 2023-04-18 ENCOUNTER — Other Ambulatory Visit: Payer: Self-pay

## 2023-04-18 ENCOUNTER — Other Ambulatory Visit: Payer: Self-pay | Admitting: Family Medicine

## 2023-04-18 MED ORDER — SPIRONOLACTONE 50 MG PO TABS
50.0000 mg | ORAL_TABLET | Freq: Every day | ORAL | 0 refills | Status: DC
  Filled 2023-04-18: qty 30, 30d supply, fill #0

## 2023-05-14 ENCOUNTER — Other Ambulatory Visit: Payer: Self-pay

## 2023-05-14 ENCOUNTER — Other Ambulatory Visit: Payer: Self-pay | Admitting: Family Medicine

## 2023-05-14 ENCOUNTER — Other Ambulatory Visit (HOSPITAL_COMMUNITY): Payer: Self-pay

## 2023-05-14 ENCOUNTER — Encounter: Payer: Self-pay | Admitting: Family Medicine

## 2023-05-14 MED ORDER — SPIRONOLACTONE 50 MG PO TABS
50.0000 mg | ORAL_TABLET | Freq: Every day | ORAL | 0 refills | Status: DC
  Filled 2023-05-14: qty 30, 30d supply, fill #0

## 2023-05-14 MED ORDER — GABAPENTIN 100 MG PO CAPS
200.0000 mg | ORAL_CAPSULE | Freq: Two times a day (BID) | ORAL | 0 refills | Status: DC
  Filled 2023-05-14 – 2023-06-14 (×3): qty 180, 45d supply, fill #0

## 2023-05-14 MED ORDER — SPIRONOLACTONE 50 MG PO TABS
50.0000 mg | ORAL_TABLET | Freq: Every day | ORAL | 1 refills | Status: DC
  Filled 2023-05-14 – 2023-08-13 (×2): qty 90, 90d supply, fill #0

## 2023-05-18 ENCOUNTER — Other Ambulatory Visit (HOSPITAL_COMMUNITY): Payer: Self-pay

## 2023-06-01 ENCOUNTER — Other Ambulatory Visit (HOSPITAL_COMMUNITY): Payer: Self-pay

## 2023-06-12 ENCOUNTER — Other Ambulatory Visit: Payer: Self-pay | Admitting: Family Medicine

## 2023-06-12 ENCOUNTER — Other Ambulatory Visit (HOSPITAL_COMMUNITY): Payer: Self-pay

## 2023-06-12 MED ORDER — DULOXETINE HCL 20 MG PO CPEP
20.0000 mg | ORAL_CAPSULE | Freq: Every day | ORAL | 3 refills | Status: DC
  Filled 2023-06-12: qty 30, 30d supply, fill #0
  Filled 2023-07-13: qty 30, 30d supply, fill #1
  Filled 2023-08-13: qty 30, 30d supply, fill #0

## 2023-06-13 ENCOUNTER — Other Ambulatory Visit (HOSPITAL_COMMUNITY): Payer: Self-pay

## 2023-06-14 ENCOUNTER — Other Ambulatory Visit (HOSPITAL_COMMUNITY): Payer: Self-pay

## 2023-08-01 ENCOUNTER — Other Ambulatory Visit: Payer: Self-pay | Admitting: Family Medicine

## 2023-08-01 ENCOUNTER — Other Ambulatory Visit (HOSPITAL_COMMUNITY): Payer: Self-pay

## 2023-08-01 MED ORDER — GABAPENTIN 100 MG PO CAPS
200.0000 mg | ORAL_CAPSULE | Freq: Two times a day (BID) | ORAL | 0 refills | Status: DC
  Filled 2023-08-01: qty 180, 45d supply, fill #0

## 2023-08-02 ENCOUNTER — Other Ambulatory Visit: Payer: Self-pay

## 2023-08-02 ENCOUNTER — Other Ambulatory Visit (HOSPITAL_COMMUNITY): Payer: Self-pay

## 2023-08-09 ENCOUNTER — Other Ambulatory Visit: Payer: Self-pay | Admitting: Family Medicine

## 2023-08-13 ENCOUNTER — Other Ambulatory Visit: Payer: Self-pay | Admitting: Family Medicine

## 2023-08-14 ENCOUNTER — Other Ambulatory Visit: Payer: Self-pay | Admitting: Family Medicine

## 2023-08-14 ENCOUNTER — Other Ambulatory Visit (HOSPITAL_COMMUNITY): Payer: Self-pay

## 2023-08-14 ENCOUNTER — Other Ambulatory Visit: Payer: Self-pay

## 2023-08-14 MED ORDER — DULOXETINE HCL 20 MG PO CPEP
20.0000 mg | ORAL_CAPSULE | Freq: Every day | ORAL | 3 refills | Status: DC
  Filled 2023-08-14: qty 30, 30d supply, fill #0
  Filled 2023-09-10: qty 30, 30d supply, fill #1
  Filled 2023-10-16: qty 30, 30d supply, fill #2
  Filled 2023-11-09: qty 30, 30d supply, fill #3

## 2023-08-14 MED ORDER — LEVOTHYROXINE SODIUM 175 MCG PO TABS
175.0000 ug | ORAL_TABLET | Freq: Every day | ORAL | 1 refills | Status: DC
  Filled 2023-08-14: qty 90, 90d supply, fill #0
  Filled 2023-11-09: qty 90, 90d supply, fill #1

## 2023-08-15 ENCOUNTER — Other Ambulatory Visit (HOSPITAL_COMMUNITY): Payer: Self-pay

## 2023-08-29 ENCOUNTER — Other Ambulatory Visit (HOSPITAL_COMMUNITY): Payer: Self-pay

## 2023-08-29 ENCOUNTER — Other Ambulatory Visit: Payer: Self-pay

## 2023-08-29 ENCOUNTER — Encounter: Payer: Self-pay | Admitting: Family Medicine

## 2023-08-29 MED ORDER — PREDNISONE 20 MG PO TABS
40.0000 mg | ORAL_TABLET | Freq: Every day | ORAL | 0 refills | Status: DC
  Filled 2023-08-29: qty 10, 5d supply, fill #0

## 2023-09-10 ENCOUNTER — Other Ambulatory Visit: Payer: Self-pay

## 2023-09-10 ENCOUNTER — Other Ambulatory Visit: Payer: Self-pay | Admitting: Family Medicine

## 2023-09-11 ENCOUNTER — Other Ambulatory Visit: Payer: Self-pay

## 2023-09-11 MED ORDER — GABAPENTIN 100 MG PO CAPS
200.0000 mg | ORAL_CAPSULE | Freq: Two times a day (BID) | ORAL | 0 refills | Status: DC
  Filled 2023-09-11: qty 180, 45d supply, fill #0

## 2023-09-18 ENCOUNTER — Encounter: Payer: 59 | Admitting: Radiology

## 2023-09-18 ENCOUNTER — Encounter: Payer: Self-pay | Admitting: Radiology

## 2023-09-18 ENCOUNTER — Other Ambulatory Visit (HOSPITAL_COMMUNITY)
Admission: RE | Admit: 2023-09-18 | Discharge: 2023-09-18 | Disposition: A | Discharge status: Home / Self Care | Payer: 59 | Source: Ambulatory Visit | Attending: Radiology | Admitting: Radiology

## 2023-09-18 ENCOUNTER — Ambulatory Visit (INDEPENDENT_AMBULATORY_CARE_PROVIDER_SITE_OTHER): Payer: 59 | Admitting: Radiology

## 2023-09-18 VITALS — BP 126/80 | Ht 65.0 in | Wt 310.0 lb

## 2023-09-18 DIAGNOSIS — N951 Menopausal and female climacteric states: Secondary | ICD-10-CM

## 2023-09-18 DIAGNOSIS — Z01419 Encounter for gynecological examination (general) (routine) without abnormal findings: Secondary | ICD-10-CM | POA: Diagnosis not present

## 2023-09-18 DIAGNOSIS — N939 Abnormal uterine and vaginal bleeding, unspecified: Secondary | ICD-10-CM | POA: Diagnosis not present

## 2023-09-18 NOTE — Progress Notes (Signed)
Emily Craig Sep 08, 1971 [id number]   History:  52 y.o. G2P2 presents for annual exam. C/o irregular periods, skips some months, had 2 periods in September. Menopausal symptoms, nigh sweats, brain fog, joint pain, fatigue. Open to HRT to manage symptoms.  Gynecologic History Patient's last menstrual period was 07/30/2023 (exact date). Period Duration (Days): 6 Period Pattern: (!) Irregular Menstrual Flow: Moderate Menstrual Control: Maxi pad, Thin pad Dysmenorrhea: (!) Moderate Dysmenorrhea Symptoms: Cramping Contraception/Family planning: none Sexually active: yes Last Pap: 2021. Results were: normal Last mammogram: 10/2022. Results were: normal Colonoscopy: 10/2020  Obstetric History OB History  Gravida Para Term Preterm AB Living  2 2       3   SAB IAB Ectopic Multiple Live Births        1 3    # Outcome Date GA Lbr Len/2nd Weight Sex Type Anes PTL Lv  2 Para           1 Para             The following portions of the patient's history were reviewed and updated as appropriate: allergies, current medications, past family history, past medical history, past social history, past surgical history, and problem list.  Review of Systems  All other systems reviewed and are negative.   Past medical history, past surgical history, family history and social history were all reviewed and documented in the EPIC chart.  Exam:  Vitals:   09/18/23 1103  BP: 126/80  Weight: (!) 310 lb (140.6 kg)  Height: 5\' 5"  (1.651 m)   Body mass index is 51.59 kg/m.  Physical Exam Vitals and nursing note reviewed. Exam conducted with a chaperone present.  Constitutional:      Appearance: Normal appearance. She is obese.  HENT:     Head: Normocephalic and atraumatic.  Neck:     Thyroid: No thyroid mass, thyromegaly or thyroid tenderness.  Cardiovascular:     Rate and Rhythm: Regular rhythm.     Heart sounds: Normal heart sounds.  Pulmonary:     Effort: Pulmonary effort is normal.      Breath sounds: Normal breath sounds.  Chest:  Breasts:    Breasts are symmetrical.     Right: Normal. No inverted nipple, mass, nipple discharge, skin change or tenderness.     Left: Normal. No inverted nipple, mass, nipple discharge, skin change or tenderness.  Abdominal:     General: Abdomen is flat. Bowel sounds are normal.     Palpations: Abdomen is soft.  Genitourinary:    General: Normal vulva.     Vagina: Normal. No vaginal discharge, bleeding or lesions.     Cervix: Normal. No discharge or lesion.     Uterus: Normal. Not enlarged and not tender.      Adnexa: Right adnexa normal and left adnexa normal.       Right: No mass, tenderness or fullness.         Left: No mass, tenderness or fullness.    Lymphadenopathy:     Upper Body:     Right upper body: No axillary adenopathy.     Left upper body: No axillary adenopathy.  Skin:    General: Skin is warm and dry.  Neurological:     Mental Status: She is alert and oriented to person, place, and time.  Psychiatric:        Mood and Affect: Mood normal.        Thought Content: Thought content normal.  Judgment: Judgment normal.      Raynelle Fanning, CMA present for exam  Assessment/Plan:   1. Well woman exam with routine gynecological exam - Cytology - PAP( [facility])  2. Abnormal uterine bleeding (AUB) - [facility] - US Transvaginal Non-OB; Future  3. Menopausal symptoms Once we know u/s and biopsy are benign will discuss HRT to manage symptoms    Discussed SBE, colonoscopy and DEXA screening as directed/appropriate. Recommend of exercise weekly, including weight bearing exercise.   Return for u/s then JC.  Arlie Solomons B WHNP-BC 11:54 AM 09/18/2023

## 2023-09-19 LAB — CYTOLOGY - PAP
Adequacy: ABSENT
Comment: NEGATIVE
Diagnosis: NEGATIVE
High risk HPV: NEGATIVE

## 2023-09-19 LAB — FOLLICLE STIMULATING HORMONE: FSH: 29.4 m[IU]/mL

## 2023-10-03 ENCOUNTER — Encounter: Payer: Self-pay | Admitting: Family Medicine

## 2023-10-07 ENCOUNTER — Other Ambulatory Visit (HOSPITAL_COMMUNITY): Payer: Self-pay

## 2023-10-07 MED ORDER — TIZANIDINE HCL 4 MG PO TABS
4.0000 mg | ORAL_TABLET | Freq: Every day | ORAL | 2 refills | Status: AC
  Filled 2023-10-07 – 2023-10-19 (×2): qty 30, 30d supply, fill #0
  Filled 2023-11-09 – 2023-11-12 (×2): qty 30, 30d supply, fill #1

## 2023-10-09 NOTE — Progress Notes (Unsigned)
[facility] [address] [place] [address] Phone: [phone] Subjective:   Bruce Donath, am serving as a scribe for Dr. Antoine Primas.  I'm seeing this patient by the request  of:  Sheliah Hatch, MD  CC: Back and neck pain mostly back.  [id number]  Emily Craig is a 52 y.o. female coming in with complaint of back and neck pain. OMT January 2024. Patient states that she continues to have R glute pain that radiates into ischia tuberosity and groin. Painful in R glute when she flexes L hip.   Medications patient has been prescribed: Cymbalta, Zanaflex, Gabapentin  Taking: Yes         Reviewed prior external information including notes and imaging from previsou exam, outside providers and external EMR if available.   As well as notes that were available from care everywhere and other healthcare systems.  Past medical history, social, surgical and family history all reviewed in electronic medical record.  No pertanent information unless stated regarding to the chief complaint.   Past Medical History:  Diagnosis Date   Arthritis    hands   Hypertension    Thyroid disease     Allergies  Allergen Reactions   Maxzide [Triamterene-Hctz] Swelling   Penicillins     Allergy since childhood/unknown reaction   Shrimp Extract     Eats shrimp all the time per pt   Sulfa Antibiotics Rash     Review of Systems:  No headache, visual changes, nausea, vomiting, diarrhea, constipation, dizziness, abdominal pain, skin rash, fevers, chills, night sweats, weight loss, swollen lymph nodes, body aches, joint swelling, chest pain, shortness of breath, mood changes. POSITIVE muscle aches  Objective  Blood pressure (!) 122/96, pulse 81, height 5\' 5"  (1.651 m), weight (!) 315 lb (142.9 kg), last menstrual period 07/30/2023, SpO2 97%.   General: No apparent distress alert and oriented x3 mood and affect normal, dressed appropriately.   HEENT: Pupils equal, extraocular movements intact  Respiratory: Patient's speak in full sentences and does not appear short of breath  Cardiovascular: No lower extremity edema, non tender, no erythema  Low back does have severe tenderness over the sacroiliac joint.  Secondary to severity of pain unable to do much MRIs of his exam but does have a negative straight leg test.   After verbal consent patient was prepped with alcohol swab with a 21-gauge 2 inch needle injected into the right side of the sacroiliac joint with a total of 0.5 cc of 0.5% Marcaine and 1 cc of Kenalog 40 mg/mL    Assessment and Plan:  SI joint arthritis (HCC) Patient given injection today and tolerated the procedure well, discussed icing regimen and home exercises, discussed which activities to do and which ones to avoid.  Unable to do osteopathic manipulation as a treatment option secondary to the tightness noted.  Discussed which activities to do and which ones to avoid.  Follow-up with me again in 6 to 8 weeks otherwise.  Differential also includes lumbar radiculopathy consider the possibility of an epidural with patient having facet arthropathy and some spinal stenosis noted on MRI previously.       The above documentation has been reviewed and is accurate and complete Judi Saa, DO          Note: This dictation was prepared with Dragon dictation along with smaller phrase technology. Any transcriptional errors that result from this process are unintentional.

## 2023-10-10 ENCOUNTER — Encounter: Payer: Self-pay | Admitting: Family Medicine

## 2023-10-10 ENCOUNTER — Other Ambulatory Visit (HOSPITAL_COMMUNITY): Payer: Self-pay

## 2023-10-10 ENCOUNTER — Ambulatory Visit: Payer: 59 | Admitting: Family Medicine

## 2023-10-10 VITALS — BP 122/96 | HR 81 | Ht 65.0 in | Wt 315.0 lb

## 2023-10-10 DIAGNOSIS — M461 Sacroiliitis, not elsewhere classified: Secondary | ICD-10-CM | POA: Diagnosis not present

## 2023-10-10 DIAGNOSIS — M5416 Radiculopathy, lumbar region: Secondary | ICD-10-CM | POA: Diagnosis not present

## 2023-10-10 MED ORDER — GABAPENTIN 100 MG PO CAPS
200.0000 mg | ORAL_CAPSULE | Freq: Two times a day (BID) | ORAL | 0 refills | Status: DC
  Filled 2023-10-10 – 2023-11-09 (×2): qty 180, 45d supply, fill #0

## 2023-10-10 NOTE — Patient Instructions (Addendum)
Epidural [phone] Injection in SI jt today See me again in 6-8 weeks

## 2023-10-10 NOTE — Assessment & Plan Note (Addendum)
Patient given injection today and tolerated the procedure well, discussed icing regimen and home exercises, discussed which activities to do and which ones to avoid.  Unable to do osteopathic manipulation as a treatment option secondary to the tightness noted.  Discussed which activities to do and which ones to avoid.  Follow-up with me again in 6 to 8 weeks otherwise.  Differential also includes lumbar radiculopathy consider the possibility of an epidural with patient having facet arthropathy and some spinal stenosis noted on MRI previously.

## 2023-10-12 ENCOUNTER — Other Ambulatory Visit: Payer: Self-pay | Admitting: Radiology

## 2023-10-12 ENCOUNTER — Ambulatory Visit: Payer: 59 | Admitting: Podiatry

## 2023-10-12 ENCOUNTER — Encounter: Payer: Self-pay | Admitting: Podiatry

## 2023-10-12 DIAGNOSIS — Q666 Other congenital valgus deformities of feet: Secondary | ICD-10-CM

## 2023-10-12 DIAGNOSIS — M722 Plantar fascial fibromatosis: Secondary | ICD-10-CM | POA: Diagnosis not present

## 2023-10-12 DIAGNOSIS — Z1231 Encounter for screening mammogram for malignant neoplasm of breast: Secondary | ICD-10-CM

## 2023-10-12 NOTE — Progress Notes (Signed)
Subjective:  Patient ID: Emily Craig, female    DOB: 07-13-1971,  MRN: [id number]  Chief Complaint  Patient presents with   Foot Orthotics    np bil foot pain( left hip bothers her currently possible from foot issue) interested in possible orthotics    52 y.o. female presents with the above complaint.  Patient presents with complaint bilateral arch pain.  She states that she is very flat-footed.  She is does a lot of nursing markings on her foot.  She wanted to get it evaluated.  Pain scale is 5 out of 10 dull aching nature.  She does not have any heel pain.  She does not wear any orthotics she would like to obtain some.   Review of Systems: Negative except as noted in the HPI. Denies N/V/F/Ch.  Past Medical History:  Diagnosis Date   Arthritis    hands   Hypertension    Thyroid disease     Current Outpatient Medications:    cetirizine (ZYRTEC) 10 MG tablet, Take 10 mg by mouth daily., Disp: , Rfl:    DULoxetine (CYMBALTA) 20 MG capsule, Take 1 capsule (20 mg total) by mouth daily., Disp: 30 capsule, Rfl: 3   gabapentin (NEURONTIN) 100 MG capsule, Take 2 capsules (200 mg total) by mouth 2 (two) times daily., Disp: 180 capsule, Rfl: 0   levothyroxine (SYNTHROID) 175 MCG tablet, Take 1 tablet (175 mcg total) by mouth daily before breakfast., Disp: 90 tablet, Rfl: 1   omeprazole (PRILOSEC) 20 MG capsule, Take 20 mg by mouth daily., Disp: , Rfl:    spironolactone (ALDACTONE) 50 MG tablet, Take 1 tablet (50 mg total) by mouth daily., Disp: 90 tablet, Rfl: 1   tiZANidine (ZANAFLEX) 4 MG tablet, Take 1 tablet (4 mg total) by mouth at bedtime for 10 days., Disp: 30 tablet, Rfl: 2  Social History   Tobacco Use  Smoking Status Never   Passive exposure: Past  Smokeless Tobacco Never    Allergies  Allergen Reactions   Maxzide [Triamterene-Hctz] Swelling   Penicillins     Allergy since childhood/unknown reaction   Shrimp Extract     Eats shrimp all the time per pt   Sulfa  Antibiotics Rash   Objective:  There were no vitals filed for this visit. There is no height or weight on file to calculate BMI. Constitutional Well developed. Well nourished.  Vascular Dorsalis pedis pulses palpable bilaterally. Posterior tibial pulses palpable bilaterally. Capillary refill normal to all digits.  No cyanosis or clubbing noted. Pedal hair growth normal.  Neurologic Normal speech. Oriented to person, place, and time. Epicritic sensation to light touch grossly present bilaterally.  Dermatologic Nails well groomed and normal in appearance. No open wounds. No skin lesions.  Orthopedic: Normal joint ROM without pain or crepitus bilaterally. No visible deformities. Tender to palpation at the calcaneal tuber bilaterally. No pain with calcaneal squeeze bilaterally. Ankle ROM diminished range of motion bilaterally. Silfverskiold Test: positive bilaterally.   Radiographs: None  Assessment:   1. Plantar fasciitis, right   2. Plantar fasciitis of left foot   3. Pes planovalgus    Plan:  Patient was evaluated and treated and all questions answered.  Plantar Fasciitis, you are wellbilaterally -Clinically patient is experiencing moderate arch pain without any localized heel pain.  At this time I discussed with patient would benefit from plantar fascia braces bilateral plantar fascial braces were dispensed.  If she starts developing some heel pain she will come back and see me right  away.  Pes planovalgus -I explained to patient the etiology of pes planovalgus and relationship with Planter fasciitis and various treatment options were discussed.  Given patient foot structure in the setting of Planter fasciitis I believe patient will benefit from custom-made orthotics to help control the hindfoot motion support the arch of the foot and take the stress away from plantar fascial.  Patient agrees with the plan like to proceed with orthotics -Patient was casted for  orthotics   No follow-ups on file.

## 2023-10-16 ENCOUNTER — Other Ambulatory Visit: Payer: Self-pay

## 2023-10-18 ENCOUNTER — Ambulatory Visit: Payer: 59 | Admitting: Radiology

## 2023-10-18 ENCOUNTER — Other Ambulatory Visit (HOSPITAL_COMMUNITY)
Admission: RE | Admit: 2023-10-18 | Discharge: 2023-10-18 | Disposition: A | Discharge status: Home / Self Care | Payer: 59 | Source: Ambulatory Visit | Attending: Radiology | Admitting: Radiology

## 2023-10-18 ENCOUNTER — Other Ambulatory Visit (HOSPITAL_COMMUNITY): Payer: Self-pay

## 2023-10-18 ENCOUNTER — Ambulatory Visit (INDEPENDENT_AMBULATORY_CARE_PROVIDER_SITE_OTHER): Payer: 59

## 2023-10-18 VITALS — BP 136/88 | HR 76

## 2023-10-18 DIAGNOSIS — N858 Other specified noninflammatory disorders of uterus: Secondary | ICD-10-CM | POA: Diagnosis not present

## 2023-10-18 DIAGNOSIS — N939 Abnormal uterine and vaginal bleeding, unspecified: Secondary | ICD-10-CM

## 2023-10-18 NOTE — Progress Notes (Signed)
      ENDOMETRIAL BIOPSY       Lendon Ka 52 y.o. presents for endometrial biopsy. Reason for biopsy: abnormal uterine bleeding. Endometrial thickness 3.18mm with current bleeding.  The indications for endometrial biopsy were reviewed.    Risks of the biopsy including cramping, bleeding, infection, uterine perforation, inadequate specimen and need for additional procedures  were discussed.  The patient states she understands and agrees to undergo procedure today. Consent obtained.  Time out was performed.    Narrative & Impression Indication: Abnormal bleeding   Anteverted uterus normal size and shape 10.05 x 6.5 x 5.13cm 2 small intramural fibroids Endometrial thickness 3.7mm   Both ovaries normal No adnexal masses No free fluid   Impression: fibroids otherwise normal ultrasound       Exam Ended: 10/18/23 10:38 Last Resulted: 10/18/23 11:11    Procedure Speculum inserted into the vagina, cervix visualized and was prepped with Betadine. A single-toothed tenaculum was placed on the anterior lip of the cervix to stabilize it.  The 3 mm pipelle was introduced into the endometrial cavity without difficulty to a depth of 8 cm, suction initiated and a moderate amount of tissue was obtained and sent to pathology.  The instruments were removed from the patient's vagina.  Minimal bleeding from the cervix was noted.  The patient tolerated the procedure well.   Raynelle Fanning, CMA present for exam  Assessment/Plan: 1. Abnormal uterine bleeding (AUB) (Primary)  - Surgical pathology( [facility]/ [facility])   Routine post-procedure instructions were given to the patient.   Will contact with results of biopsy.    Arlie Solomons, [facility]

## 2023-10-19 ENCOUNTER — Other Ambulatory Visit: Payer: Self-pay

## 2023-10-19 ENCOUNTER — Other Ambulatory Visit (HOSPITAL_COMMUNITY): Payer: Self-pay

## 2023-10-19 LAB — SURGICAL PATHOLOGY

## 2023-10-23 NOTE — Progress Notes (Signed)
Orthotic order placed will call patient when in to set delivery  Nicki [place]

## 2023-11-09 ENCOUNTER — Other Ambulatory Visit: Payer: Self-pay | Admitting: Family Medicine

## 2023-11-09 ENCOUNTER — Other Ambulatory Visit (HOSPITAL_BASED_OUTPATIENT_CLINIC_OR_DEPARTMENT_OTHER): Payer: Self-pay

## 2023-11-09 ENCOUNTER — Other Ambulatory Visit: Payer: Self-pay

## 2023-11-09 ENCOUNTER — Other Ambulatory Visit (HOSPITAL_COMMUNITY): Payer: Self-pay

## 2023-11-09 MED ORDER — SPIRONOLACTONE 50 MG PO TABS
50.0000 mg | ORAL_TABLET | Freq: Every day | ORAL | 1 refills | Status: DC
  Filled 2023-11-09: qty 90, 90d supply, fill #0
  Filled 2024-02-12: qty 90, 90d supply, fill #1

## 2023-11-12 ENCOUNTER — Telehealth: Payer: Self-pay

## 2023-11-12 DIAGNOSIS — Z1231 Encounter for screening mammogram for malignant neoplasm of breast: Secondary | ICD-10-CM

## 2023-11-12 NOTE — Telephone Encounter (Signed)
 Called to schedule orthotic  appt

## 2023-11-14 ENCOUNTER — Other Ambulatory Visit (HOSPITAL_COMMUNITY): Payer: Self-pay

## 2023-11-14 ENCOUNTER — Other Ambulatory Visit: Payer: Self-pay | Admitting: Radiology

## 2023-11-14 ENCOUNTER — Other Ambulatory Visit: Payer: Self-pay

## 2023-11-14 DIAGNOSIS — N951 Menopausal and female climacteric states: Secondary | ICD-10-CM

## 2023-11-14 MED ORDER — PROGESTERONE MICRONIZED 100 MG PO CAPS
100.0000 mg | ORAL_CAPSULE | Freq: Every day | ORAL | 1 refills | Status: DC
  Filled 2023-11-14: qty 90, 90d supply, fill #0
  Filled 2024-01-30: qty 90, 90d supply, fill #1

## 2023-11-14 MED ORDER — ESTRADIOL 0.025 MG/24HR TD PTTW
1.0000 | MEDICATED_PATCH | TRANSDERMAL | 1 refills | Status: DC
  Filled 2023-11-14: qty 24, 84d supply, fill #0
  Filled 2024-01-30: qty 24, 84d supply, fill #1

## 2023-11-21 NOTE — Progress Notes (Deleted)
[facility] [address] [place] [address] Phone: [phone] Subjective:    I'm seeing this patient by the request  of:  Sheliah Hatch, MD  CC: si joint pain   [id number]  10/10/2023 Patient given injection today and tolerated the procedure well, discussed icing regimen and home exercises, discussed which activities to do and which ones to avoid.  Unable to do osteopathic manipulation as a treatment option secondary to the tightness noted.  Discussed which activities to do and which ones to avoid.  Follow-up with me again in 6 to 8 weeks otherwise.  Differential also includes lumbar radiculopathy consider the possibility of an epidural with patient having facet arthropathy and some spinal stenosis noted on MRI previously.     Update 11/22/2023 Emily Craig is a 53 y.o. female coming in with complaint of SI joint arthritis. Patient states   Patient has also been seen within the last 2 months for abdominal pain and abnormal uterine bleeding.  Did have a biopsy which was benign.  Was to start hormone replacement therapy.    Past Medical History:  Diagnosis Date   Arthritis    hands   Hypertension    Thyroid disease    Past Surgical History:  Procedure Laterality Date   CHOLECYSTECTOMY     TONSILLECTOMY AND ADENOIDECTOMY     Social History   Socioeconomic History   Marital status: Married    Spouse name: Harvie Heck   Number of children: 3   Years of education: RN   Highest education level: Not on file  Occupational History   Occupation: Teacher, adult education: [employer]    Comment: [employer]  Tobacco Use   Smoking status: Never    Passive exposure: Past   Smokeless tobacco: Never  Vaping Use   Vaping status: Never Used  Substance and Sexual Activity   Alcohol use: Yes    Alcohol/week: 0.0 standard drinks of alcohol    Comment: rarely   Drug use: No   Sexual activity: Yes    Partners: Male    Birth control/protection:  None    Comment: menarche 53yo, sexual debut 53yo  Other Topics Concern   Not on file  Social History Narrative   Lives with her husband and their daughter.  Their sons (twins) stayed in [place] when they moved here June 2014.   Social Drivers of Corporate investment banker Strain: Not on file  Food Insecurity: Not on file  Transportation Needs: Not on file  Physical Activity: Not on file  Stress: Not on file  Social Connections: Not on file   Allergies  Allergen Reactions   Maxzide [Triamterene-Hctz] Swelling   Penicillins     Allergy since childhood/unknown reaction   Shrimp Extract     Eats shrimp all the time per pt   Sulfa Antibiotics Rash   Family History  Problem Relation Age of Onset   Hypertension Mother    Aortic aneurysm Mother    Cancer Mother        pancreatic cancer   Pancreatic cancer Mother    Heart disease Father    Hyperlipidemia Brother    Hypertension Brother    Diabetes Maternal Grandmother    Heart disease Maternal Grandmother    Cancer Maternal Grandmother        reproductive    Liver disease Maternal Grandfather    Stroke Paternal Grandmother    Breast cancer Cousin 82  paternal side    Colon cancer Neg Hx    Esophageal cancer Neg Hx    Stomach cancer Neg Hx    Rectal cancer Neg Hx     Current Outpatient Medications (Endocrine & Metabolic):    estradiol (VIVELLE-DOT) 0.025 MG/24HR, Place 1 patch onto the skin 2 (two) times a week.   levothyroxine (SYNTHROID) 175 MCG tablet, Take 1 tablet (175 mcg total) by mouth daily before breakfast.   progesterone (PROMETRIUM) 100 MG capsule, Take 1 capsule (100 mg total) by mouth daily.  Current Outpatient Medications (Cardiovascular):    spironolactone (ALDACTONE) 50 MG tablet, Take 1 tablet (50 mg total) by mouth daily.  Current Outpatient Medications (Respiratory):    cetirizine (ZYRTEC) 10 MG tablet, Take 10 mg by mouth daily.    Current Outpatient Medications (Other):     DULoxetine (CYMBALTA) 20 MG capsule, Take 1 capsule (20 mg total) by mouth daily.   gabapentin (NEURONTIN) 100 MG capsule, Take 2 capsules (200 mg total) by mouth 2 (two) times daily.   omeprazole (PRILOSEC) 20 MG capsule, Take 20 mg by mouth daily.   tiZANidine (ZANAFLEX) 4 MG tablet, Take 1 tablet (4 mg total) by mouth at bedtime for 10 days.   Reviewed prior external information including notes and imaging from  primary care provider As well as notes that were available from care everywhere and other healthcare systems.  Past medical history, social, surgical and family history all reviewed in electronic medical record.  No pertanent information unless stated regarding to the chief complaint.   Review of Systems:  No headache, visual changes, nausea, vomiting, diarrhea, constipation, dizziness, abdominal pain, skin rash, fevers, chills, night sweats, weight loss, swollen lymph nodes, body aches, joint swelling, chest pain, shortness of breath, mood changes. POSITIVE muscle aches  Objective  There were no vitals taken for this visit.   General: No apparent distress alert and oriented x3 mood and affect normal, dressed appropriately.  HEENT: Pupils equal, extraocular movements intact  Respiratory: Patient's speak in full sentences and does not appear short of breath  Cardiovascular: No lower extremity edema, non tender, no erythema   Back exam shows    Impression and Recommendations:     The above documentation has been reviewed and is accurate and complete Judi Saa, DO

## 2023-11-22 ENCOUNTER — Ambulatory Visit: Payer: Commercial Managed Care - PPO | Admitting: Family Medicine

## 2023-11-27 ENCOUNTER — Ambulatory Visit: Payer: 59 | Admitting: Family Medicine

## 2023-11-27 NOTE — Progress Notes (Signed)
[facility] [address] [place] [address] Phone: [phone] Subjective:   INadine Counts, am serving as a scribe for Dr. Antoine Primas.  I'm seeing this patient by the request  of:  Sheliah Hatch, MD  CC: Low back pain  ION:[id number]  10/10/2023 Patient given injection today and tolerated the procedure well, discussed icing regimen and home exercises, discussed which activities to do and which ones to avoid. Unable to do osteopathic manipulation as a treatment option secondary to the tightness noted. Discussed which activities to do and which ones to avoid. Follow-up with me again in 6 to 8 weeks otherwise. Differential also includes lumbar radiculopathy consider the possibility of an epidural with patient having facet arthropathy and some spinal stenosis noted on MRI previously.   Updated 11/29/2023 Azhane Eckart is a 53 y.o. female coming in with complaint of SI joint pain. Much better since last visit. Left starting to notice pain. 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  States that it is affecting daily activities.  Thumbs have made it difficult to do certain activities.  Has noticed sometimes that she wakes up with pain.  Has had cramping in the thumbs as well as in the lower extremities.       Past Medical History:  Diagnosis Date   Arthritis    hands   Hypertension    Thyroid disease    Past Surgical History:  Procedure Laterality Date   CHOLECYSTECTOMY     TONSILLECTOMY AND ADENOIDECTOMY     Social History   Socioeconomic History   Marital status: Married    Spouse name: Harvie Heck   Number of children: 3   Years of education: RN   Highest education level: Not on file  Occupational History   Occupation: Teacher, adult education: [place]    Comment: [employer]  Tobacco Use   Smoking status: Never    Passive exposure: Past   Smokeless tobacco: Never  Vaping Use   Vaping status: Never Used  Substance and Sexual  Activity   Alcohol use: Yes    Alcohol/week: 0.0 standard drinks of alcohol    Comment: rarely   Drug use: No   Sexual activity: Yes    Partners: Male    Birth control/protection: None    Comment: menarche 53yo, sexual debut 53yo  Other Topics Concern   Not on file  Social History Narrative   Lives with her husband and their daughter.  Their sons (twins) stayed in [place] when they moved here June 2014.   Social Drivers of Corporate investment banker Strain: Not on file  Food Insecurity: Not on file  Transportation Needs: Not on file  Physical Activity: Not on file  Stress: Not on file  Social Connections: Not on file   Allergies  Allergen Reactions   Maxzide [Triamterene-Hctz] Swelling   Penicillins     Allergy since childhood/unknown reaction   Shrimp Extract     Eats shrimp all the time per pt   Sulfa Antibiotics Rash   Family History  Problem Relation Age of Onset   Hypertension Mother    Aortic aneurysm Mother    Cancer Mother        pancreatic cancer   Pancreatic cancer Mother    Heart disease Father    Hyperlipidemia Brother    Hypertension Brother    Diabetes Maternal Grandmother    Heart disease Maternal Grandmother    Cancer Maternal Grandmother  reproductive    Liver disease Maternal Grandfather    Stroke Paternal Grandmother    Breast cancer Cousin 50       paternal side    Colon cancer Neg Hx    Esophageal cancer Neg Hx    Stomach cancer Neg Hx    Rectal cancer Neg Hx     Current Outpatient Medications (Endocrine & Metabolic):    estradiol (VIVELLE-DOT) 0.025 MG/24HR, Place 1 patch onto the skin 2 (two) times a week.   levothyroxine (SYNTHROID) 175 MCG tablet, Take 1 tablet (175 mcg total) by mouth daily before breakfast.   progesterone (PROMETRIUM) 100 MG capsule, Take 1 capsule (100 mg total) by mouth daily.  Current Outpatient Medications (Cardiovascular):    spironolactone (ALDACTONE) 50 MG tablet, Take 1 tablet (50 mg total) by  mouth daily.  Current Outpatient Medications (Respiratory):    cetirizine (ZYRTEC) 10 MG tablet, Take 10 mg by mouth daily.    Current Outpatient Medications (Other):    DULoxetine (CYMBALTA) 20 MG capsule, Take 1 capsule (20 mg total) by mouth daily.   gabapentin (NEURONTIN) 100 MG capsule, Take 2 capsules (200 mg total) by mouth 2 (two) times daily.   omeprazole (PRILOSEC) 20 MG capsule, Take 20 mg by mouth daily.   tiZANidine (ZANAFLEX) 4 MG tablet, Take 1 tablet (4 mg total) by mouth at bedtime for 10 days.   Reviewed prior external information including notes and imaging from  primary care provider As well as notes that were available from care everywhere and other healthcare systems.  Past medical history, social, surgical and family history all reviewed in electronic medical record.  No pertanent information unless stated regarding to the chief complaint.   Review of Systems:  No headache, visual changes, nausea, vomiting, diarrhea, constipation, dizziness, abdominal pain, skin rash, fevers, chills, night sweats, weight loss, swollen lymph nodes, body aches, joint swelling, chest pain, shortness of breath, mood changes. POSITIVE muscle aches and cramping mostly at night  Objective  Blood pressure 128/76, pulse 84, height 5\' 5"  (1.651 m), SpO2 98%.   General: No apparent distress alert and oriented x3 mood and affect normal, dressed appropriately.  HEENT: Pupils equal, extraocular movements intact  Respiratory: Patient's speak in full sentences and does not appear short of breath  Cardiovascular: No lower extremity edema, non tender, no erythema  Patient is standing more comfortable.  He still severely tender over the sacroiliac joints.  Tightness in the neck with sidebending.    Impression and Recommendations:    The above documentation has been reviewed and is accurate and complete Judi Saa, DO

## 2023-11-28 ENCOUNTER — Ambulatory Visit: Payer: 59

## 2023-11-29 ENCOUNTER — Ambulatory Visit
Admission: RE | Admit: 2023-11-29 | Discharge: 2023-11-29 | Disposition: A | Discharge status: Home / Self Care | Payer: Commercial Managed Care - PPO | Source: Ambulatory Visit | Attending: Radiology | Admitting: Radiology

## 2023-11-29 ENCOUNTER — Ambulatory Visit: Payer: Commercial Managed Care - PPO | Admitting: Family Medicine

## 2023-11-29 ENCOUNTER — Encounter: Payer: Self-pay | Admitting: Family Medicine

## 2023-11-29 VITALS — BP 128/76 | HR 84 | Ht 65.0 in

## 2023-11-29 DIAGNOSIS — M5030 Other cervical disc degeneration, unspecified cervical region: Secondary | ICD-10-CM

## 2023-11-29 DIAGNOSIS — Z1231 Encounter for screening mammogram for malignant neoplasm of breast: Secondary | ICD-10-CM

## 2023-11-29 DIAGNOSIS — M5410 Radiculopathy, site unspecified: Secondary | ICD-10-CM

## 2023-11-29 DIAGNOSIS — M2550 Pain in unspecified joint: Secondary | ICD-10-CM

## 2023-11-29 DIAGNOSIS — R252 Cramp and spasm: Secondary | ICD-10-CM

## 2023-11-29 DIAGNOSIS — G4730 Sleep apnea, unspecified: Secondary | ICD-10-CM | POA: Diagnosis not present

## 2023-11-29 LAB — COMPREHENSIVE METABOLIC PANEL
ALT: 16 U/L (ref 0–35)
AST: 12 U/L (ref 0–37)
Albumin: 4.4 g/dL (ref 3.5–5.2)
Alkaline Phosphatase: 57 U/L (ref 39–117)
BUN: 15 mg/dL (ref 6–23)
CO2: 27 meq/L (ref 19–32)
Calcium: 9.4 mg/dL (ref 8.4–10.5)
Chloride: 101 meq/L (ref 96–112)
Creatinine, Ser: 1.07 mg/dL (ref 0.40–1.20)
GFR: 58.74 mL/min — ABNORMAL LOW (ref >60.00)
Glucose, Bld: 115 mg/dL — ABNORMAL HIGH (ref 70–99)
Potassium: 4 meq/L (ref 3.5–5.1)
Sodium: 137 meq/L (ref 135–145)
Total Bilirubin: 0.4 mg/dL (ref 0.2–1.2)
Total Protein: 7.5 g/dL (ref 6.0–8.3)

## 2023-11-29 LAB — CBC WITH DIFFERENTIAL/PLATELET
Basophils Absolute: 0.1 10*3/uL (ref 0.0–0.1)
Basophils Relative: 0.8 % (ref 0.0–3.0)
Eosinophils Absolute: 0.2 10*3/uL (ref 0.0–0.7)
Eosinophils Relative: 2 % (ref 0.0–5.0)
HCT: 39.3 % (ref 36.0–46.0)
Hemoglobin: 12.8 g/dL (ref 12.0–15.0)
Lymphocytes Relative: 34.9 % (ref 12.0–46.0)
Lymphs Abs: 2.7 10*3/uL (ref 0.7–4.0)
MCHC: 32.6 g/dL (ref 30.0–36.0)
MCV: 85 fL (ref 78.0–100.0)
Monocytes Absolute: 0.6 10*3/uL (ref 0.1–1.0)
Monocytes Relative: 7.6 % (ref 3.0–12.0)
Neutro Abs: 4.3 10*3/uL (ref 1.4–7.7)
Neutrophils Relative %: 54.7 % (ref 43.0–77.0)
Platelets: 279 10*3/uL (ref 150.0–400.0)
RBC: 4.62 Mil/uL (ref 3.87–5.11)
RDW: 14.3 % (ref 11.5–15.5)
WBC: 7.8 10*3/uL (ref 4.0–10.5)

## 2023-11-29 LAB — FERRITIN: Ferritin: 26.8 ng/mL (ref 10.0–291.0)

## 2023-11-29 LAB — IBC PANEL
Iron: 65 ug/dL (ref 42–145)
Saturation Ratios: 15.8 % — ABNORMAL LOW (ref 20.0–50.0)
TIBC: 411.6 ug/dL (ref 250.0–450.0)
Transferrin: 294 mg/dL (ref 212.0–360.0)

## 2023-11-29 NOTE — Patient Instructions (Addendum)
Good to see you  Sleep study ordered Get labs on the way out  See me again in 8 weeks

## 2023-12-01 ENCOUNTER — Encounter: Payer: Self-pay | Admitting: Family Medicine

## 2023-12-01 DIAGNOSIS — R252 Cramp and spasm: Secondary | ICD-10-CM | POA: Insufficient documentation

## 2023-12-01 NOTE — Assessment & Plan Note (Addendum)
Nocturnal muscle cramping with snoring.  Will get further evaluation with sleep study.  Has been told by family members that she does snore we will get labs as well to further evaluate.   duloxetine at 20 mg.  Do feel it would help.

## 2023-12-01 NOTE — Assessment & Plan Note (Signed)
Known arthritic changes of the neck as well as lower back.  We will continue to monitor.  Patient has done relatively well and has lost weight.  Responding extremely well to osteopathic manipulation as a modality but would like to see patient again in 2 to 3 months for further evaluation.

## 2023-12-01 NOTE — Assessment & Plan Note (Signed)
Encourage patient to continue to work on weight loss.

## 2023-12-12 ENCOUNTER — Ambulatory Visit: Payer: Commercial Managed Care - PPO | Admitting: Podiatry

## 2023-12-12 DIAGNOSIS — M722 Plantar fascial fibromatosis: Secondary | ICD-10-CM

## 2023-12-12 DIAGNOSIS — Q666 Other congenital valgus deformities of feet: Secondary | ICD-10-CM | POA: Diagnosis not present

## 2023-12-12 NOTE — Progress Notes (Signed)
 Subjective:  Patient ID: Emily Craig, female    DOB: 1971/10/09,  MRN: [id number]  Chief Complaint  Patient presents with   Plantar Fasciitis    RM#11 Patient states plantar fasciitis symptoms coming back in right foot feels like walking on a marble.    53 y.o. female presents with the above complaint.  Patient presents with complaint bilateral arch pain.  She states this has Deis comes and goes.  Plantar fascia braces do help.  She is noticing the plantar fasciitis symptoms coming back   Review of Systems: Negative except as noted in the HPI. Denies N/V/F/Ch.  Past Medical History:  Diagnosis Date   Arthritis    hands   Hypertension    Thyroid  disease     Current Outpatient Medications:    cetirizine  (ZYRTEC ) 10 MG tablet, Take 10 mg by mouth daily., Disp: , Rfl:    DULoxetine  (CYMBALTA ) 20 MG capsule, Take 1 capsule (20 mg total) by mouth daily., Disp: 30 capsule, Rfl: 3   estradiol  (VIVELLE -DOT) 0.025 MG/24HR, Place 1 patch onto the skin 2 (two) times a week., Disp: 24 patch, Rfl: 1   gabapentin  (NEURONTIN ) 100 MG capsule, Take 2 capsules (200 mg total) by mouth 2 (two) times daily., Disp: 180 capsule, Rfl: 0   levothyroxine  (SYNTHROID ) 175 MCG tablet, Take 1 tablet (175 mcg total) by mouth daily before breakfast., Disp: 90 tablet, Rfl: 1   omeprazole (PRILOSEC) 20 MG capsule, Take 20 mg by mouth daily., Disp: , Rfl:    progesterone  (PROMETRIUM ) 100 MG capsule, Take 1 capsule (100 mg total) by mouth daily., Disp: 90 capsule, Rfl: 1   spironolactone  (ALDACTONE ) 50 MG tablet, Take 1 tablet (50 mg total) by mouth daily., Disp: 90 tablet, Rfl: 1  Social History   Tobacco Use  Smoking Status Never   Passive exposure: Past  Smokeless Tobacco Never    Allergies  Allergen Reactions   Maxzide [Triamterene -Hctz] Swelling   Penicillins     Allergy since childhood/unknown reaction   Shrimp Extract     Eats shrimp all the time per pt   Sulfa Antibiotics Rash   Objective:   There were no vitals filed for this visit. There is no height or weight on file to calculate BMI. Constitutional Well developed. Well nourished.  Vascular Dorsalis pedis pulses palpable bilaterally. Posterior tibial pulses palpable bilaterally. Capillary refill normal to all digits.  No cyanosis or clubbing noted. Pedal hair growth normal.  Neurologic Normal speech. Oriented to person, place, and time. Epicritic sensation to light touch grossly present bilaterally.  Dermatologic Nails well groomed and normal in appearance. No open wounds. No skin lesions.  Orthopedic: Normal joint ROM without pain or crepitus bilaterally. No visible deformities. Tender to palpation at the calcaneal tuber bilaterally. No pain with calcaneal squeeze bilaterally. Ankle ROM diminished range of motion bilaterally. Silfverskiold Test: positive bilaterally.   Radiographs: None  Assessment:   No diagnosis found.  Plan:  Patient was evaluated and treated and all questions answered.  Plantar Fasciitis, you are wellbilaterally -Clinically patient is able to manage her plantar fasciitis pain.  The plantar fascia brace is helping.  This time shoe gear modification discussed.  Will continue orthotics management if it regresses we will discuss steroid injection  Pes planovalgus -I explained to patient the etiology of pes planovalgus and relationship with Planter fasciitis and various treatment options were discussed.  Given patient foot structure in the setting of Planter fasciitis I believe patient will benefit from custom-made orthotics to  help control the hindfoot motion support the arch of the foot and take the stress away from plantar fascial.  Patient agrees with the plan like to proceed with orthotics -Orthotics were dispensed they are functioning well.   No follow-ups on file.

## 2023-12-19 ENCOUNTER — Other Ambulatory Visit: Payer: Self-pay

## 2023-12-19 ENCOUNTER — Other Ambulatory Visit: Payer: Self-pay | Admitting: Family Medicine

## 2023-12-19 ENCOUNTER — Other Ambulatory Visit (HOSPITAL_COMMUNITY): Payer: Self-pay

## 2023-12-19 MED ORDER — DULOXETINE HCL 20 MG PO CPEP
20.0000 mg | ORAL_CAPSULE | Freq: Every day | ORAL | 3 refills | Status: DC
  Filled 2023-12-19: qty 30, 30d supply, fill #0
  Filled 2024-01-13: qty 30, 30d supply, fill #1
  Filled 2024-02-12: qty 30, 30d supply, fill #2
  Filled 2024-03-13: qty 30, 30d supply, fill #3

## 2024-01-13 ENCOUNTER — Other Ambulatory Visit: Payer: Self-pay | Admitting: Family Medicine

## 2024-01-14 ENCOUNTER — Other Ambulatory Visit: Payer: Self-pay

## 2024-01-14 ENCOUNTER — Other Ambulatory Visit (HOSPITAL_COMMUNITY): Payer: Self-pay

## 2024-01-14 MED ORDER — GABAPENTIN 100 MG PO CAPS
200.0000 mg | ORAL_CAPSULE | Freq: Two times a day (BID) | ORAL | 0 refills | Status: DC
  Filled 2024-01-14: qty 180, 45d supply, fill #0

## 2024-01-15 ENCOUNTER — Other Ambulatory Visit: Payer: Self-pay

## 2024-01-15 ENCOUNTER — Encounter (HOSPITAL_BASED_OUTPATIENT_CLINIC_OR_DEPARTMENT_OTHER): Payer: Self-pay

## 2024-01-30 ENCOUNTER — Ambulatory Visit: Payer: Commercial Managed Care - PPO | Admitting: Family Medicine

## 2024-01-31 ENCOUNTER — Other Ambulatory Visit: Payer: Self-pay

## 2024-01-31 ENCOUNTER — Other Ambulatory Visit (HOSPITAL_COMMUNITY): Payer: Self-pay

## 2024-02-12 ENCOUNTER — Other Ambulatory Visit (HOSPITAL_COMMUNITY): Payer: Self-pay

## 2024-02-12 ENCOUNTER — Other Ambulatory Visit: Payer: Self-pay | Admitting: Family Medicine

## 2024-02-13 ENCOUNTER — Other Ambulatory Visit: Payer: Self-pay

## 2024-02-13 ENCOUNTER — Other Ambulatory Visit (HOSPITAL_COMMUNITY): Payer: Self-pay

## 2024-02-13 MED ORDER — LEVOTHYROXINE SODIUM 175 MCG PO TABS
175.0000 ug | ORAL_TABLET | Freq: Every day | ORAL | 1 refills | Status: DC
  Filled 2024-02-13: qty 90, 90d supply, fill #0
  Filled 2024-05-12: qty 90, 90d supply, fill #1

## 2024-02-26 ENCOUNTER — Encounter: Payer: Self-pay | Admitting: Family Medicine

## 2024-02-26 ENCOUNTER — Other Ambulatory Visit: Payer: Self-pay | Admitting: Family Medicine

## 2024-02-27 ENCOUNTER — Other Ambulatory Visit: Payer: Self-pay

## 2024-02-27 ENCOUNTER — Other Ambulatory Visit (HOSPITAL_COMMUNITY): Payer: Self-pay

## 2024-02-27 MED ORDER — GABAPENTIN 100 MG PO CAPS
200.0000 mg | ORAL_CAPSULE | Freq: Two times a day (BID) | ORAL | 0 refills | Status: DC
  Filled 2024-02-27: qty 180, 45d supply, fill #0

## 2024-02-27 MED ORDER — LIDOCAINE 5 % EX PTCH
1.0000 | MEDICATED_PATCH | Freq: Two times a day (BID) | CUTANEOUS | 0 refills | Status: DC
  Filled 2024-02-27 – 2024-03-10 (×2): qty 60, 30d supply, fill #0

## 2024-03-10 ENCOUNTER — Other Ambulatory Visit (HOSPITAL_COMMUNITY): Payer: Self-pay

## 2024-03-13 ENCOUNTER — Other Ambulatory Visit (HOSPITAL_COMMUNITY): Payer: Self-pay

## 2024-04-09 ENCOUNTER — Other Ambulatory Visit (HOSPITAL_COMMUNITY): Payer: Self-pay

## 2024-04-09 ENCOUNTER — Other Ambulatory Visit: Payer: Self-pay | Admitting: Family Medicine

## 2024-04-09 ENCOUNTER — Other Ambulatory Visit: Payer: Self-pay | Admitting: Radiology

## 2024-04-09 DIAGNOSIS — N951 Menopausal and female climacteric states: Secondary | ICD-10-CM

## 2024-04-09 MED ORDER — PROGESTERONE MICRONIZED 100 MG PO CAPS
100.0000 mg | ORAL_CAPSULE | Freq: Every day | ORAL | 1 refills | Status: DC
  Filled 2024-04-09 – 2024-04-14 (×3): qty 90, 90d supply, fill #0
  Filled 2024-07-04: qty 90, 90d supply, fill #1
  Filled 2024-07-04: qty 90, 90d supply, fill #0

## 2024-04-09 NOTE — Telephone Encounter (Signed)
 Med refill request: progesterone  100 mg Last AEX: 09/18/23 Next AEX: none scheduled Last MMG (if hormonal med) 11/29/23 BI-RADS 1 negative Refill authorized:  progesterone  100 mg. Please approve or deny as appropriate.

## 2024-04-10 ENCOUNTER — Other Ambulatory Visit (HOSPITAL_COMMUNITY): Payer: Self-pay

## 2024-04-10 ENCOUNTER — Other Ambulatory Visit: Payer: Self-pay

## 2024-04-10 MED ORDER — GABAPENTIN 100 MG PO CAPS
200.0000 mg | ORAL_CAPSULE | Freq: Two times a day (BID) | ORAL | 0 refills | Status: DC
  Filled 2024-04-10: qty 180, 45d supply, fill #0

## 2024-04-10 MED ORDER — DULOXETINE HCL 20 MG PO CPEP
20.0000 mg | ORAL_CAPSULE | Freq: Every day | ORAL | 3 refills | Status: DC
  Filled 2024-04-10: qty 30, 30d supply, fill #0
  Filled 2024-05-12: qty 30, 30d supply, fill #1
  Filled 2024-06-10: qty 30, 30d supply, fill #2

## 2024-04-14 ENCOUNTER — Other Ambulatory Visit: Payer: Self-pay

## 2024-04-14 ENCOUNTER — Other Ambulatory Visit (HOSPITAL_COMMUNITY): Payer: Self-pay

## 2024-04-30 ENCOUNTER — Other Ambulatory Visit: Payer: Self-pay | Admitting: Radiology

## 2024-04-30 DIAGNOSIS — N951 Menopausal and female climacteric states: Secondary | ICD-10-CM

## 2024-05-01 ENCOUNTER — Other Ambulatory Visit: Payer: Self-pay

## 2024-05-01 ENCOUNTER — Other Ambulatory Visit (HOSPITAL_COMMUNITY): Payer: Self-pay

## 2024-05-01 ENCOUNTER — Encounter: Payer: Self-pay | Admitting: Family Medicine

## 2024-05-01 ENCOUNTER — Ambulatory Visit: Admitting: Family Medicine

## 2024-05-01 MED ORDER — ESTRADIOL 0.025 MG/24HR TD PTTW
1.0000 | MEDICATED_PATCH | TRANSDERMAL | 1 refills | Status: DC
Start: 2024-05-01 — End: 2024-06-18
  Filled 2024-05-01: qty 24, 84d supply, fill #0

## 2024-05-01 NOTE — Telephone Encounter (Signed)
 FYI Please call patient to reschedule.

## 2024-05-01 NOTE — Telephone Encounter (Signed)
 Med refill request: estradiol  0.025 mg patch Last AEX: 09/18/23 Next AEX: none scheduled Last MMG (if hormonal med) 11/29/23 BI-RADS 1 negative Refill authorized: Please approve or deny as appropriate.

## 2024-05-12 ENCOUNTER — Other Ambulatory Visit (HOSPITAL_COMMUNITY): Payer: Self-pay

## 2024-05-12 ENCOUNTER — Other Ambulatory Visit: Payer: Self-pay

## 2024-05-12 ENCOUNTER — Other Ambulatory Visit: Payer: Self-pay | Admitting: Family Medicine

## 2024-05-12 MED ORDER — SPIRONOLACTONE 50 MG PO TABS
50.0000 mg | ORAL_TABLET | Freq: Every day | ORAL | 1 refills | Status: DC
  Filled 2024-05-12: qty 90, 90d supply, fill #0
  Filled 2024-08-06: qty 90, 90d supply, fill #1

## 2024-05-12 NOTE — Telephone Encounter (Signed)
 Patient doesn't want to see another provider, wants to know if this can be refilled and she see you after your return?

## 2024-05-22 ENCOUNTER — Ambulatory Visit (HOSPITAL_BASED_OUTPATIENT_CLINIC_OR_DEPARTMENT_OTHER): Admitting: Pulmonary Disease

## 2024-05-22 ENCOUNTER — Encounter (HOSPITAL_BASED_OUTPATIENT_CLINIC_OR_DEPARTMENT_OTHER): Payer: Self-pay | Admitting: Pulmonary Disease

## 2024-05-22 VITALS — BP 147/87 | HR 70 | Ht 65.0 in | Wt 321.4 lb

## 2024-05-22 DIAGNOSIS — I10 Essential (primary) hypertension: Secondary | ICD-10-CM

## 2024-05-22 DIAGNOSIS — G4733 Obstructive sleep apnea (adult) (pediatric): Secondary | ICD-10-CM | POA: Diagnosis not present

## 2024-05-22 DIAGNOSIS — M1990 Unspecified osteoarthritis, unspecified site: Secondary | ICD-10-CM | POA: Diagnosis not present

## 2024-05-22 NOTE — Progress Notes (Signed)
 New Patient Pulmonology Office Visit   Subjective:  Patient ID: Emily Craig, female    DOB: 1971/11/01  MRN: [id number]  Referred by: Mahlon Comer BRAVO, MD  CC:  Chief Complaint  Patient presents with   Establish Care    Sleep apnea      Discussed the use of AI scribe software for clinical note transcription with the patient, who gave verbal consent to proceed.  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 She was referred by Dr. Zach Smith for evaluation of suspected sleep apnea.  She experiences severe leg cramps at night, affecting her thighs, feet, ankles, and shins. Her husband observes snoring without apneic episodes. Snoring resolved with weight loss but returned with weight gain. She sometimes wakes with frontal headaches and has variable sleep quality. Energy levels remain good during the day without significant fatigue or need for naps. She maintains an active lifestyle and does not feel excessively tired after meals. Bedtime varies between 10 PM and 12 AM, with wake-up around 5:30 AM. Earlier waking improves daytime well-being. Hypertension is managed with Sporanox, and blood pressure is usually well-controlled. She takes thyroid  medication, with past normal thyroid  function tests.  ESS 7. Bedtime 10 PM to midnight, sleep latency up to 1 hour, sleeps on her side, 0-3 nocturnal awakenings, wakes up at 5:30 AM on workdays and 8 AM on weekends Gained 30 pounds in the last 6 months 2 to 3 cups of coffee daily  ROS Positive for acid heartburn, morning headaches, anxiety, joint stiffness  Constitutional: negative for anorexia, fevers and sweats  Eyes: negative for irritation, redness and visual disturbance  Ears, nose, mouth, throat, and face: negative for earaches, epistaxis, nasal congestion and sore throat  Respiratory: negative for cough, dyspnea on exertion, sputum and  wheezing  Cardiovascular: negative for chest pain, dyspnea, lower extremity edema, orthopnea, palpitations and syncope  Gastrointestinal: negative for abdominal pain, constipation, diarrhea, melena, nausea and vomiting  Genitourinary:negative for dysuria, frequency and hematuria  Hematologic/lymphatic: negative for bleeding, easy bruising and lymphadenopathy  Musculoskeletal:negative for arthralgias, muscle weakness  Neurological: negative for coordination problems, gait problems, headaches and weakness  Endocrine: negative for diabetic symptoms including polydipsia, polyuria and weight loss   Allergies: Maxzide [triamterene -hctz], Penicillins, Shrimp extract, and Sulfa antibiotics  Current Outpatient Medications:    cetirizine  (ZYRTEC ) 10 MG tablet, Take 10 mg by mouth daily., Disp: , Rfl:    DULoxetine  (CYMBALTA ) 20 MG capsule, Take 1 capsule (20 mg total) by mouth daily., Disp: 30 capsule, Rfl: 3   estradiol  (VIVELLE -DOT) 0.025 MG/24HR, Place 1 patch onto the skin 2 (two) times a week., Disp: 24 patch, Rfl: 1   gabapentin  (NEURONTIN ) 100 MG capsule, Take 2 capsules (200 mg total) by mouth 2 (two) times daily., Disp: 180 capsule, Rfl: 0   levothyroxine  (SYNTHROID ) 175 MCG tablet, Take 1 tablet (175 mcg total) by mouth daily before breakfast., Disp: 90 tablet, Rfl: 1   lidocaine  (LIDODERM ) 5 %, Place 1 patch onto the skin every 12 (twelve) hours. Remove & Discard patch within 12 hours or as directed by MD, Disp: 60 patch, Rfl: 0   omeprazole (PRILOSEC) 20 MG capsule, Take 20 mg by mouth daily., Disp: , Rfl:    progesterone  (PROMETRIUM ) 100 MG capsule, Take 1 capsule (100 mg total) by mouth daily., Disp: 90 capsule, Rfl: 1   spironolactone  (ALDACTONE ) 50 MG tablet, Take 1 tablet (50 mg  total) by mouth daily., Disp: 90 tablet, Rfl: 1 Past Medical History:  Diagnosis Date   Arthritis    hands   Hypertension    Thyroid  disease    Past Surgical History:  Procedure Laterality Date    CHOLECYSTECTOMY     TONSILLECTOMY AND ADENOIDECTOMY     Family History  Problem Relation Age of Onset   Hypertension Mother    Aortic aneurysm Mother    Cancer Mother        pancreatic cancer   Pancreatic cancer Mother    Heart disease Father    Hyperlipidemia Brother    Hypertension Brother    Diabetes Maternal Grandmother    Heart disease Maternal Grandmother    Cancer Maternal Grandmother        reproductive    Liver disease Maternal Grandfather    Stroke Paternal Grandmother    Breast cancer Cousin 50       paternal side    Colon cancer Neg Hx    Esophageal cancer Neg Hx    Stomach cancer Neg Hx    Rectal cancer Neg Hx    Social History   Socioeconomic History   Marital status: Married    Spouse name: Darina   Number of children: 3   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   Highest education level: Not on file  Occupational History   Occupation: Teacher, adult education: [employer]    Comment: [employer]  Tobacco Use   Smoking status: Never    Passive exposure: Past   Smokeless tobacco: Never  Vaping Use   Vaping status: Never Used  Substance and Sexual Activity   Alcohol use: Yes    Alcohol/week: 0.0 standard drinks of alcohol    Comment: rarely   Drug use: No   Sexual activity: Yes    Partners: Male    Birth control/protection: None    Comment: menarche 53yo, sexual debut 53yo  Other Topics Concern   Not on file  Social History Narrative   Lives with her husband and their daughter.  Their sons (twins) stayed in [place] when they moved here June 2014.   Social Drivers of Corporate investment banker Strain: Not on file  Food Insecurity: Not on file  Transportation Needs: Not on file  Physical Activity: Not on file  Stress: Not on file  Social Connections: Not on file  Intimate Partner Violence: Not on file       Objective:  BP (!) 147/87   Pulse 70   Ht 5' 5 (1.651 m)   Wt (!) 321 lb 6.4 oz (145.8 kg)   SpO2 98%   BMI 53.48 kg/m    Physical  Exam  Gen. Pleasant, obese, in no distress, normal affect ENT - no pallor,icterus, no post nasal drip, class 2-3 airway Neck: No JVD, no thyromegaly, no carotid bruits Lungs: no use of accessory muscles, no dullness to percussion, decreased without rales or rhonchi  Cardiovascular: Rhythm regular, heart sounds  normal, no murmurs or gallops, no peripheral edema Abdomen: soft and non-tender, no hepatosplenomegaly, BS normal. Musculoskeletal: No deformities, no cyanosis or clubbing Neuro:  alert, non focal, no tremors       Assessment & Plan:  Assessment and Plan Assessment & Plan OSA for NOT just sleep but notes been either last week or Possible sleep apnea with symptoms of snoring, morning headaches, and leg cramps. Husband reports snoring without observed apneas. Weight gain may contribute to symptoms. Denies excessive daytime sleepiness  but reports subtle signs suggestive of sleep apnea. Differential includes hypothyroidism and sinus issues, though less likely. - Order home sleep test (Snap or similar) to evaluate for sleep apnea. - If sleep apnea is confirmed, initiate CPAP therapy to assess symptom improvement. - Consider dental appliance if CPAP is effective but not tolerated. - Follow up on test results and adjust treatment plan accordingly.  Hypertension Hypertension managed with Sporanox. Blood pressure generally well-controlled, though elevated today. No significant cardiovascular symptoms reported.  Arthritis and joint pain Chronic arthritis and joint pain managed by Dr. Darlyn Sharps. Recent severe leg cramp possibly related to sleep apnea.       No follow-ups on file.   Harden ROCKFORD Jude, MD

## 2024-05-22 NOTE — Progress Notes (Signed)
 Epworth Sleepiness Scale  Use the following scale to choose the most appropriate number for each situation. 0 Would never nod off 1  Slight  chance of nodding off 2 Moderate chance of nodding off 3 High chance of nodding off  Sitting and reading: 3 Watching TV: 2 Sitting, inactive, in a public place (e.g., in a meeting, theater, or dinner event): 0 As a passenger in a car for an hour or more without stopping for a break: 0 Lying down to rest when circumstances permit:2 Sitting and talking to someone: 0 Sitting quietly after a meal without alcohol: 0 In a car, while stopped for a few  minutes in traffic or at a light: 0  TOTOAL: 7

## 2024-06-10 ENCOUNTER — Other Ambulatory Visit: Payer: Self-pay

## 2024-06-10 ENCOUNTER — Other Ambulatory Visit (HOSPITAL_COMMUNITY): Payer: Self-pay

## 2024-06-10 ENCOUNTER — Other Ambulatory Visit: Payer: Self-pay | Admitting: Family Medicine

## 2024-06-10 MED ORDER — GABAPENTIN 100 MG PO CAPS
200.0000 mg | ORAL_CAPSULE | Freq: Two times a day (BID) | ORAL | 0 refills | Status: DC
  Filled 2024-06-10: qty 180, 45d supply, fill #0

## 2024-06-12 ENCOUNTER — Ambulatory Visit: Admitting: Family Medicine

## 2024-06-18 ENCOUNTER — Other Ambulatory Visit: Payer: Self-pay | Admitting: Radiology

## 2024-06-18 ENCOUNTER — Other Ambulatory Visit (HOSPITAL_COMMUNITY): Payer: Self-pay

## 2024-06-18 ENCOUNTER — Ambulatory Visit

## 2024-06-18 DIAGNOSIS — G4733 Obstructive sleep apnea (adult) (pediatric): Secondary | ICD-10-CM

## 2024-06-18 DIAGNOSIS — N951 Menopausal and female climacteric states: Secondary | ICD-10-CM

## 2024-06-18 MED ORDER — ESTRADIOL 0.0375 MG/24HR TD PTTW
1.0000 | MEDICATED_PATCH | TRANSDERMAL | 12 refills | Status: DC
Start: 2024-06-19 — End: 2024-09-26
  Filled 2024-06-18: qty 8, 28d supply, fill #0
  Filled 2024-07-04 (×3): qty 8, 28d supply, fill #1
  Filled 2024-08-06: qty 8, 28d supply, fill #2
  Filled 2024-09-05: qty 8, 28d supply, fill #3
  Filled ????-??-??: fill #1

## 2024-06-20 NOTE — Progress Notes (Unsigned)
 [facility] [address] [place] [address] Phone: [phone] Subjective:   ISusannah Craig, am serving as a scribe for Dr. Arthea Claudene.  I'm seeing this patient by the request  of:  Mahlon Comer BRAVO, MD  CC: Low back pain  YEP:Dlagzrupcz  Emily Craig is a 53 y.o. female coming in with complaint of SI joint pain. Last seen in January 2025 for cervical spine and acute lumbar radiculopathy. Was doing well until about July. Hurting mostly at night on R side. Feels like it goes around leg and radiates down thigh. Having low R back pain.     Past Medical History:  Diagnosis Date   Arthritis    hands   Hypertension    Thyroid  disease    Past Surgical History:  Procedure Laterality Date   CHOLECYSTECTOMY     TONSILLECTOMY AND ADENOIDECTOMY     Social History   Socioeconomic History   Marital status: Married    Spouse name: Darina   Number of children: 3   Years of education: RN   Highest education level: Not on file  Occupational History   Occupation: Teacher, adult education: [place]    Comment: [employer]  Tobacco Use   Smoking status: Never    Passive exposure: Past   Smokeless tobacco: Never  Vaping Use   Vaping status: Never Used  Substance and Sexual Activity   Alcohol use: Yes    Alcohol/week: 0.0 standard drinks of alcohol    Comment: rarely   Drug use: No   Sexual activity: Yes    Partners: Male    Birth control/protection: None    Comment: menarche 53yo, sexual debut 53yo  Other Topics Concern   Not on file  Social History Narrative   Lives with her husband and their daughter.  Their sons (twins) stayed in [place] when they moved here June 2014.   Social Drivers of Corporate investment banker Strain: Not on file  Food Insecurity: Not on file  Transportation Needs: Not on file  Physical Activity: Not on file  Stress: Not on file  Social Connections: Not on file   Allergies  Allergen Reactions    Maxzide [Triamterene -Hctz] Swelling   Penicillins     Allergy since childhood/unknown reaction   Shrimp Extract     Eats shrimp all the time per pt   Sulfa Antibiotics Rash   Family History  Problem Relation Age of Onset   Hypertension Mother    Aortic aneurysm Mother    Cancer Mother        pancreatic cancer   Pancreatic cancer Mother    Heart disease Father    Hyperlipidemia Brother    Hypertension Brother    Diabetes Maternal Grandmother    Heart disease Maternal Grandmother    Cancer Maternal Grandmother        reproductive    Liver disease Maternal Grandfather    Stroke Paternal Grandmother    Breast cancer Cousin 50       paternal side    Colon cancer Neg Hx    Esophageal cancer Neg Hx    Stomach cancer Neg Hx    Rectal cancer Neg Hx     Current Outpatient Medications (Endocrine & Metabolic):    estradiol  (VIVELLE -DOT) 0.0375 MG/24HR, Place 1 patch onto the skin 2 (two) times a week.   levothyroxine  (SYNTHROID ) 175 MCG tablet, Take 1 tablet (175 mcg total) by mouth daily before breakfast.  progesterone  (PROMETRIUM ) 100 MG capsule, Take 1 capsule (100 mg total) by mouth daily.  Current Outpatient Medications (Cardiovascular):    spironolactone  (ALDACTONE ) 50 MG tablet, Take 1 tablet (50 mg total) by mouth daily.  Current Outpatient Medications (Respiratory):    cetirizine  (ZYRTEC ) 10 MG tablet, Take 10 mg by mouth daily.    Current Outpatient Medications (Other):    DULoxetine  (CYMBALTA ) 30 MG capsule, Take 1 capsule (30 mg total) by mouth daily.   DULoxetine  (CYMBALTA ) 20 MG capsule, Take 1 capsule (20 mg total) by mouth daily.   gabapentin  (NEURONTIN ) 100 MG capsule, Take 2 capsules (200 mg total) by mouth 2 (two) times daily.   lidocaine  (LIDODERM ) 5 %, Place 1 patch onto the skin every 12 (twelve) hours. Remove & Discard patch within 12 hours or as directed by MD   omeprazole (PRILOSEC) 20 MG capsule, Take 20 mg by mouth daily.   Reviewed prior external  information including notes and imaging from  primary care provider As well as notes that were available from care everywhere and other healthcare systems.  Past medical history, social, surgical and family history all reviewed in electronic medical record.  No pertanent information unless stated regarding to the chief complaint.   Review of Systems:  No headache, visual changes, nausea, vomiting, diarrhea, constipation, dizziness, abdominal pain, skin rash, fevers, chills, night sweats, weight loss, swollen lymph nodes, body aches, joint swelling, chest pain, shortness of breath, mood changes. POSITIVE muscle aches  Objective  Blood pressure 124/84, pulse 81, height 5' 5 (1.651 m), weight (!) 313 lb (142 kg), SpO2 96%.   General: No apparent distress alert and oriented x3 mood and affect normal, dressed appropriately.  HEENT: Pupils equal, extraocular movements intact  Respiratory: Patient's speak in full sentences and does not appear short of breath  Cardiovascular: No lower extremity edema, non tender, no erythema  Low back does still have tenderness over the SI joint on the right side.  12 difficulty with FABER test noted.    Impression and Recommendations:      The above documentation has been reviewed and is accurate and complete Zoi Devine M Candee Hoon, DO

## 2024-06-25 ENCOUNTER — Ambulatory Visit: Admitting: Family Medicine

## 2024-06-25 ENCOUNTER — Encounter: Payer: Self-pay | Admitting: Family Medicine

## 2024-06-25 ENCOUNTER — Other Ambulatory Visit (HOSPITAL_COMMUNITY): Payer: Self-pay

## 2024-06-25 VITALS — BP 124/84 | HR 81 | Ht 65.0 in | Wt 313.0 lb

## 2024-06-25 DIAGNOSIS — M461 Sacroiliitis, not elsewhere classified: Secondary | ICD-10-CM

## 2024-06-25 MED ORDER — DULOXETINE HCL 30 MG PO CPEP
30.0000 mg | ORAL_CAPSULE | Freq: Every day | ORAL | 0 refills | Status: DC
  Filled 2024-06-25: qty 30, 30d supply, fill #0

## 2024-06-25 NOTE — Patient Instructions (Addendum)
 Increase Cymbalta  30mg  Sleep with pillow underneath stomach to avoid excessive extension Update us  in 3-4 weeks See you again in 3 months

## 2024-06-25 NOTE — Assessment & Plan Note (Signed)
 Has had difficulty with this but was responding extremely well.  Was to start attempting to lose weight and is down 8 pounds.  Encouraged her to continue to do so.  Increase Cymbalta  to 30 mg, discussed sleeping position.  Has done well overall.  Could do another injection in the sacroiliac joint if needed.  Had done some manipulation previously as well but will hold.  Follow-up again in 6 to 8 weeks

## 2024-06-25 NOTE — Assessment & Plan Note (Signed)
 Encouraged continued weight loss, also awaiting the results of the sleep study that could be contributing to some of the nighttime pain.

## 2024-07-04 ENCOUNTER — Other Ambulatory Visit (HOSPITAL_COMMUNITY): Payer: Self-pay

## 2024-07-18 ENCOUNTER — Telehealth: Payer: Self-pay | Admitting: Pulmonary Disease

## 2024-07-18 DIAGNOSIS — R069 Unspecified abnormalities of breathing: Secondary | ICD-10-CM | POA: Diagnosis not present

## 2024-07-18 DIAGNOSIS — G4733 Obstructive sleep apnea (adult) (pediatric): Secondary | ICD-10-CM

## 2024-07-18 NOTE — Telephone Encounter (Signed)
 HST showed severe OSA with AHI 32/ hr & low sat 73% This does explain her symptoms Would she be ok with starting CPAP therapy  Suggest autoCPAP  5-15 cm, mask of choice OV with me/APP in 6 wks after starting

## 2024-07-19 ENCOUNTER — Other Ambulatory Visit: Payer: Self-pay | Admitting: Family Medicine

## 2024-07-19 ENCOUNTER — Encounter: Payer: Self-pay | Admitting: Family Medicine

## 2024-07-21 ENCOUNTER — Other Ambulatory Visit (HOSPITAL_COMMUNITY): Payer: Self-pay

## 2024-07-21 MED ORDER — DULOXETINE HCL 30 MG PO CPEP
30.0000 mg | ORAL_CAPSULE | Freq: Every day | ORAL | 1 refills | Status: AC
  Filled 2024-07-21 – 2024-08-06 (×2): qty 90, 90d supply, fill #0

## 2024-07-21 MED ORDER — DULOXETINE HCL 30 MG PO CPEP
30.0000 mg | ORAL_CAPSULE | Freq: Every day | ORAL | 0 refills | Status: DC
  Filled 2024-07-21: qty 30, 30d supply, fill #0

## 2024-07-21 NOTE — Telephone Encounter (Signed)
 Tried to reach out to pt phone just rings no VM

## 2024-07-22 NOTE — Telephone Encounter (Signed)
Left pt message to return call 

## 2024-07-23 NOTE — Telephone Encounter (Signed)
 Left pt message that I was going to send a My chart message so we didn't have to play phone tag.

## 2024-07-30 ENCOUNTER — Other Ambulatory Visit (HOSPITAL_COMMUNITY): Payer: Self-pay

## 2024-08-06 ENCOUNTER — Other Ambulatory Visit (HOSPITAL_COMMUNITY): Payer: Self-pay

## 2024-08-06 ENCOUNTER — Other Ambulatory Visit: Payer: Self-pay | Admitting: Family Medicine

## 2024-08-06 ENCOUNTER — Other Ambulatory Visit: Payer: Self-pay

## 2024-08-06 MED ORDER — GABAPENTIN 100 MG PO CAPS
200.0000 mg | ORAL_CAPSULE | Freq: Two times a day (BID) | ORAL | 0 refills | Status: AC
  Filled 2024-08-06: qty 180, 45d supply, fill #0

## 2024-08-06 MED ORDER — LIDOCAINE 5 % EX PTCH
1.0000 | MEDICATED_PATCH | Freq: Two times a day (BID) | CUTANEOUS | 0 refills | Status: AC
  Filled 2024-08-06: qty 60, 30d supply, fill #0

## 2024-08-06 MED ORDER — LEVOTHYROXINE SODIUM 175 MCG PO TABS
175.0000 ug | ORAL_TABLET | Freq: Every day | ORAL | 0 refills | Status: DC
  Filled 2024-08-06: qty 30, 30d supply, fill #0

## 2024-08-07 ENCOUNTER — Other Ambulatory Visit: Payer: Self-pay

## 2024-08-20 ENCOUNTER — Other Ambulatory Visit (HOSPITAL_BASED_OUTPATIENT_CLINIC_OR_DEPARTMENT_OTHER): Payer: Self-pay

## 2024-08-20 ENCOUNTER — Ambulatory Visit: Admitting: Family Medicine

## 2024-08-20 MED ORDER — CHLORHEXIDINE GLUCONATE 0.12 % MT SOLN
OROMUCOSAL | 3 refills | Status: AC
  Filled 2024-08-20 – 2024-09-01 (×2): qty 473, 30d supply, fill #0

## 2024-08-25 DIAGNOSIS — G4733 Obstructive sleep apnea (adult) (pediatric): Secondary | ICD-10-CM | POA: Diagnosis not present

## 2024-09-01 ENCOUNTER — Other Ambulatory Visit (HOSPITAL_BASED_OUTPATIENT_CLINIC_OR_DEPARTMENT_OTHER): Payer: Self-pay

## 2024-09-01 NOTE — Progress Notes (Deleted)
 [facility] [address] [place] [address] Phone: [phone] Subjective:    I'm seeing this patient by the request  of:  Emily Comer BRAVO, MD  CC: Low back pain follow-up  YEP:Dlagzrupcz  Emily Craig is a 53 y.o. female coming in with complaint of low back pain.  Patient at last exam was given duloxetine  at 30 mg.    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  Their sons (twins) stayed in [place] when they moved here June 2014.   Social Drivers of Corporate Investment Banker Strain: Not on file  Food Insecurity: Not on file  Transportation Needs: Not on file  Physical Activity: Not on file  Stress: Not on file  Social Connections: Not on file   Allergies  Allergen Reactions   Maxzide [Triamterene -Hctz] Swelling   Penicillins     Allergy since childhood/unknown reaction   Shrimp Extract     Eats shrimp all the time per pt   Sulfa Antibiotics Rash   Family History  Problem Relation Age of  Onset   Hypertension Mother    Aortic aneurysm Mother    Cancer Mother        pancreatic cancer   Pancreatic cancer Mother    Heart disease Father    Hyperlipidemia Brother    Hypertension Brother    Diabetes Maternal Grandmother    Heart disease Maternal Grandmother    Cancer Maternal Grandmother        reproductive    Liver disease Maternal Grandfather    Stroke Paternal Grandmother    Breast cancer Cousin 50       paternal side    Colon cancer Neg Hx    Esophageal cancer Neg Hx    Stomach cancer Neg Hx    Rectal cancer Neg Hx     Current Outpatient Medications (Endocrine & Metabolic):    estradiol  (VIVELLE -DOT) 0.0375 MG/24HR, Place 1 patch onto the skin 2 (two) times a week.   levothyroxine  (SYNTHROID ) 175 MCG tablet, Take 1 tablet (175 mcg total) by mouth daily before breakfast.   progesterone  (PROMETRIUM ) 100 MG capsule, Take 1 capsule (100 mg total) by mouth daily.  Current Outpatient Medications (Cardiovascular):    spironolactone  (ALDACTONE ) 50 MG tablet, Take 1 tablet (50 mg total) by mouth daily.  Current Outpatient  Medications (Respiratory):    cetirizine  (ZYRTEC ) 10 MG tablet, Take 10 mg by mouth daily.    Current Outpatient Medications (Other):    chlorhexidine  (PERIDEX ) 0.12 % solution, Rinse with 15ml for 30 seconds twice per day, after brushing and flossing teeth. DO NOT EAT OR DRINK FOR 30 MINS AFTER USE.   DULoxetine  (CYMBALTA ) 30 MG capsule, Take 1 capsule (30 mg total) by mouth daily.   gabapentin  (NEURONTIN ) 100 MG capsule, Take 2 capsules (200 mg total) by mouth 2 (two) times daily.   lidocaine  (LIDODERM ) 5 %, Place 1 patch onto the skin every 12 (twelve) hours. Remove & Discard patch within 12 hours or as directed by MD   omeprazole (PRILOSEC) 20 MG capsule, Take 20 mg by mouth daily.   Reviewed prior external information including notes and imaging from  primary care provider As well as notes that were available from care everywhere and other  healthcare systems.  Past medical history, social, surgical and family history all reviewed in electronic medical record.  No pertanent information unless stated regarding to the chief complaint.   Review of Systems:  No headache, visual changes, nausea, vomiting, diarrhea, constipation, dizziness, abdominal pain, skin rash, fevers, chills, night sweats, weight loss, swollen lymph nodes, body aches, joint swelling, chest pain, shortness of breath, mood changes. POSITIVE muscle aches  Objective  There were no vitals taken for this visit.   General: No apparent distress alert and oriented x3 mood and affect normal, dressed appropriately.  HEENT: Pupils equal, extraocular movements intact  Respiratory: Patient's speak in full sentences and does not appear short of breath  Cardiovascular: No lower extremity edema, non tender, no erythema   Low back exam shows   Impression and Recommendations:     The above documentation has been reviewed and is accurate and complete Talbert Trembath M Filiberto Wamble, DO

## 2024-09-02 ENCOUNTER — Other Ambulatory Visit: Payer: Self-pay

## 2024-09-02 ENCOUNTER — Ambulatory Visit: Admitting: Family Medicine

## 2024-09-02 ENCOUNTER — Encounter: Payer: Self-pay | Admitting: Family Medicine

## 2024-09-02 ENCOUNTER — Other Ambulatory Visit (HOSPITAL_COMMUNITY): Payer: Self-pay

## 2024-09-02 VITALS — BP 132/84 | HR 78 | Temp 98.7°F | Ht 65.0 in | Wt 312.5 lb

## 2024-09-02 DIAGNOSIS — E039 Hypothyroidism, unspecified: Secondary | ICD-10-CM | POA: Diagnosis not present

## 2024-09-02 DIAGNOSIS — E559 Vitamin D deficiency, unspecified: Secondary | ICD-10-CM

## 2024-09-02 DIAGNOSIS — I10 Essential (primary) hypertension: Secondary | ICD-10-CM | POA: Diagnosis not present

## 2024-09-02 LAB — HEPATIC FUNCTION PANEL
ALT: 14 U/L (ref 0–35)
AST: 12 U/L (ref 0–37)
Albumin: 4.1 g/dL (ref 3.5–5.2)
Alkaline Phosphatase: 54 U/L (ref 39–117)
Bilirubin, Direct: 0.1 mg/dL (ref 0.0–0.3)
Total Bilirubin: 0.5 mg/dL (ref 0.2–1.2)
Total Protein: 7.1 g/dL (ref 6.0–8.3)

## 2024-09-02 LAB — BASIC METABOLIC PANEL WITH GFR
BUN: 16 mg/dL (ref 6–23)
CO2: 26 meq/L (ref 19–32)
Calcium: 9 mg/dL (ref 8.4–10.5)
Chloride: 102 meq/L (ref 96–112)
Creatinine, Ser: 0.81 mg/dL (ref 0.40–1.20)
GFR: 81.6 mL/min (ref >60.00)
Glucose, Bld: 90 mg/dL (ref 70–99)
Potassium: 4.3 meq/L (ref 3.5–5.1)
Sodium: 137 meq/L (ref 135–145)

## 2024-09-02 LAB — LIPID PANEL
Cholesterol: 186 mg/dL (ref 0–200)
HDL: 47.8 mg/dL (ref >39.00)
LDL Cholesterol: 118 mg/dL — ABNORMAL HIGH (ref 0–99)
NonHDL: 138.13
Total CHOL/HDL Ratio: 4
Triglycerides: 101 mg/dL (ref 0.0–149.0)
VLDL: 20.2 mg/dL (ref 0.0–40.0)

## 2024-09-02 LAB — CBC WITH DIFFERENTIAL/PLATELET
Basophils Absolute: 0 K/uL (ref 0.0–0.1)
Basophils Relative: 0.8 % (ref 0.0–3.0)
Eosinophils Absolute: 0.1 K/uL (ref 0.0–0.7)
Eosinophils Relative: 2.5 % (ref 0.0–5.0)
HCT: 39 % (ref 36.0–46.0)
Hemoglobin: 12.4 g/dL (ref 12.0–15.0)
Lymphocytes Relative: 29.2 % (ref 12.0–46.0)
Lymphs Abs: 1.7 K/uL (ref 0.7–4.0)
MCHC: 31.8 g/dL (ref 30.0–36.0)
MCV: 83 fl (ref 78.0–100.0)
Monocytes Absolute: 0.5 K/uL (ref 0.1–1.0)
Monocytes Relative: 8.8 % (ref 3.0–12.0)
Neutro Abs: 3.4 K/uL (ref 1.4–7.7)
Neutrophils Relative %: 58.7 % (ref 43.0–77.0)
Platelets: 246 K/uL (ref 150.0–400.0)
RBC: 4.7 Mil/uL (ref 3.87–5.11)
RDW: 13.9 % (ref 11.5–15.5)
WBC: 5.8 K/uL (ref 4.0–10.5)

## 2024-09-02 LAB — TSH: TSH: 0.4 u[IU]/mL (ref 0.35–5.50)

## 2024-09-02 LAB — VITAMIN D 25 HYDROXY (VIT D DEFICIENCY, FRACTURES): VITD: 28.03 ng/mL — ABNORMAL LOW (ref 30.00–100.00)

## 2024-09-02 MED ORDER — SPIRONOLACTONE 50 MG PO TABS
50.0000 mg | ORAL_TABLET | Freq: Every day | ORAL | 1 refills | Status: AC
  Filled 2024-09-02: qty 90, 90d supply, fill #0
  Filled 2024-11-28: qty 30, 30d supply, fill #0

## 2024-09-02 MED ORDER — LEVOTHYROXINE SODIUM 175 MCG PO TABS
175.0000 ug | ORAL_TABLET | Freq: Every day | ORAL | 1 refills | Status: AC
  Filled 2024-09-02: qty 90, 90d supply, fill #0

## 2024-09-02 NOTE — Progress Notes (Signed)
   Subjective:    Patient ID: Emily [facility], female    DOB: 28-Mar-1971, 53 y.o.   MRN: [id number]  HPI Hypothyroid- chronic problem, on Levothyroxine  175mcg daily.  Denies fatigue, changes to skin/hair/nails.  HTN- chronic problem, on Spironolactone  50mg  daily.  No CP, SOB, HA's, visual changes, edema.  Obesity- weight is stable.  BMI 52.  Just started CPAP last week.  Pt reports improved rest, improved mental clarity.  Pt is very stressed about her weight- 'i beat myself up'.   Review of Systems For ROS see HPI     Objective:   Physical Exam Vitals reviewed.  Constitutional:      General: She is not in acute distress.    Appearance: Normal appearance. She is well-developed. She is obese. She is not ill-appearing.  HENT:     Head: Normocephalic and atraumatic.  Eyes:     Conjunctiva/sclera: Conjunctivae normal.     Pupils: Pupils are equal, round, and reactive to light.  Neck:     Thyroid : No thyromegaly.  Cardiovascular:     Rate and Rhythm: Normal rate and regular rhythm.     Pulses: Normal pulses.     Heart sounds: Normal heart sounds. No murmur heard. Pulmonary:     Effort: Pulmonary effort is normal. No respiratory distress.     Breath sounds: Normal breath sounds.  Abdominal:     General: There is no distension.     Palpations: Abdomen is soft.     Tenderness: There is no abdominal tenderness.  Musculoskeletal:     Cervical back: Normal range of motion and neck supple.     Right lower leg: No edema.     Left lower leg: No edema.  Lymphadenopathy:     Cervical: No cervical adenopathy.  Skin:    General: Skin is warm and dry.  Neurological:     General: No focal deficit present.     Mental Status: She is alert and oriented to person, place, and time.  Psychiatric:        Mood and Affect: Mood normal.        Behavior: Behavior normal.        Thought Content: Thought content normal.          Assessment & Plan:

## 2024-09-02 NOTE — Assessment & Plan Note (Signed)
 Chronic problem.  Currently controlled on Spironolactone  50mg  daily.  Asymptomatic.  Check labs due to diuretic use but no anticipated med changes.

## 2024-09-02 NOTE — Assessment & Plan Note (Signed)
 Chronic problem.  On Levothyroxine  175mcg daily.  Currently asymptomatic.  Check labs.  Adjust meds prn

## 2024-09-02 NOTE — Patient Instructions (Signed)
 Follow up in 3 months to recheck weight loss progress and discuss options We'll notify you of your lab results and make any changes if needed Continue to work on healthy diet and regular exercise- you can do it! I'm SO proud of you for making positive changes Call with any questions or concerns Stay Safe!  Stay Healthy! Happy Fall!!!

## 2024-09-02 NOTE — Assessment & Plan Note (Signed)
 Ongoing issue.  Weight is stable, BMI 52.  Pt is very frustrated w/ her weight and it really bothers her.  Just started CPAP.  This should improve sleep and energy level.  May also aid in weight loss.  Check labs to risk stratify.  Will follow.

## 2024-09-03 ENCOUNTER — Other Ambulatory Visit (HOSPITAL_BASED_OUTPATIENT_CLINIC_OR_DEPARTMENT_OTHER): Payer: Self-pay

## 2024-09-03 ENCOUNTER — Other Ambulatory Visit: Payer: Self-pay

## 2024-09-03 ENCOUNTER — Other Ambulatory Visit (HOSPITAL_COMMUNITY): Payer: Self-pay

## 2024-09-03 ENCOUNTER — Ambulatory Visit: Payer: Self-pay | Admitting: Family Medicine

## 2024-09-03 MED ORDER — VITAMIN D (ERGOCALCIFEROL) 1.25 MG (50000 UNIT) PO CAPS
50000.0000 [IU] | ORAL_CAPSULE | ORAL | 0 refills | Status: DC
  Filled 2024-09-03: qty 12, 84d supply, fill #0

## 2024-09-03 MED ORDER — VITAMIN D (ERGOCALCIFEROL) 1.25 MG (50000 UNIT) PO CAPS
50000.0000 [IU] | ORAL_CAPSULE | ORAL | 0 refills | Status: AC
  Filled 2024-09-03 – 2024-10-10 (×2): qty 12, 84d supply, fill #0

## 2024-09-03 NOTE — Progress Notes (Signed)
 Vitamin D  50,000 has been sent. Pt has been notified

## 2024-09-05 ENCOUNTER — Other Ambulatory Visit: Payer: Self-pay | Admitting: Radiology

## 2024-09-05 DIAGNOSIS — N951 Menopausal and female climacteric states: Secondary | ICD-10-CM

## 2024-09-06 ENCOUNTER — Other Ambulatory Visit (HOSPITAL_COMMUNITY): Payer: Self-pay

## 2024-09-08 ENCOUNTER — Other Ambulatory Visit (HOSPITAL_BASED_OUTPATIENT_CLINIC_OR_DEPARTMENT_OTHER): Payer: Self-pay

## 2024-09-08 MED ORDER — PROGESTERONE MICRONIZED 100 MG PO CAPS
100.0000 mg | ORAL_CAPSULE | Freq: Every day | ORAL | 0 refills | Status: DC
  Filled 2024-09-08 – 2024-10-12 (×2): qty 90, 90d supply, fill #0

## 2024-09-08 NOTE — Telephone Encounter (Signed)
 Med refill request:   progesterone  (PROMETRIUM ) 100 MG capsule   Start:  04/09/24 - Last Disp: 07/04/24 Disp:  90 capsules  Refills:  0 of 1 remaining  Last AEX:  09/18/23 Next AEX:  Not yet scheduled Last MMG (if hormonal med):  11/29/23 Refill authorized? Please Advise.

## 2024-09-09 ENCOUNTER — Other Ambulatory Visit: Payer: Self-pay

## 2024-09-10 ENCOUNTER — Other Ambulatory Visit (HOSPITAL_COMMUNITY): Payer: Self-pay

## 2024-09-13 ENCOUNTER — Other Ambulatory Visit (HOSPITAL_BASED_OUTPATIENT_CLINIC_OR_DEPARTMENT_OTHER): Payer: Self-pay

## 2024-09-19 NOTE — Progress Notes (Unsigned)
 [facility] [address] [place] [address] Phone: [phone] Subjective:   ISusannah Gully, am serving as a scribe for Dr. Arthea Claudene.  I'm seeing this patient by the request  of:  Mahlon Comer BRAVO, MD  CC: Low back pain follow-up  YEP:Dlagzrupcz  06/25/2024 Has had difficulty with this but was responding extremely well. Was to start attempting to lose weight and is down 8 pounds. Encouraged her to continue to do so. Increase Cymbalta  to 30 mg, discussed sleeping position. Has done well overall. Could do another injection in the sacroiliac joint if needed. Had done some manipulation previously as well but will hold. Follow-up again in 6 to 8 weeks   Update 09/22/2024 Adrine Hayworth is a 53 y.o. female coming in with complaint of low back pain. Here today for shoulder pain. Patient states R shoulder pain for about 2 months. No MOI, limited ROM, weakness in arm, feels tight, mostly throbbing and burning, constant, location of pain is anterior over biceps tendon and posterior. Has lidocaine  patches, takes the edge off. Will take tylenol advil combo.  Patient at last exam was given duloxetine  at 30 mg.    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  Their sons (twins) stayed in [place] when they moved here June 2014.   Social Drivers of Corporate Investment Banker Strain: Not on file  Food Insecurity: Not on file  Transportation Needs: Not on file  Physical Activity: Not on file  Stress: Not on file  Social Connections: Not on file   Allergies  Allergen Reactions   Maxzide [Triamterene -Hctz] Swelling   Penicillins     Allergy since childhood/unknown reaction   Shrimp Extract     Eats shrimp all the time per pt   Sulfa Antibiotics Rash   Family History  Problem Relation Age of Onset   Hypertension Mother    Aortic aneurysm Mother    Cancer Mother        pancreatic cancer   Pancreatic cancer Mother    Heart disease Father    Hyperlipidemia Brother    Hypertension Brother    Diabetes Maternal Grandmother    Heart disease Maternal Grandmother    Cancer Maternal Grandmother        reproductive    Liver disease Maternal Grandfather  Stroke Paternal Grandmother    Breast cancer Cousin 50       paternal side    Colon cancer Neg Hx    Esophageal cancer Neg Hx    Stomach cancer Neg Hx    Rectal cancer Neg Hx     Current Outpatient Medications (Endocrine & Metabolic):    estradiol  (VIVELLE -DOT) 0.0375 MG/24HR, Place 1 patch onto the skin 2 (two) times a week.   levothyroxine  (SYNTHROID ) 175 MCG tablet, Take 1 tablet (175 mcg total) by mouth daily before breakfast.   progesterone  (PROMETRIUM ) 100 MG capsule, Take 1 capsule (100 mg total) by mouth daily.  Current Outpatient Medications (Cardiovascular):    spironolactone  (ALDACTONE ) 50 MG tablet, Take 1 tablet (50 mg total) by mouth daily.  Current Outpatient Medications (Respiratory):    cetirizine  (ZYRTEC ) 10 MG tablet, Take 10 mg  by mouth daily.    Current Outpatient Medications (Other):    chlorhexidine (PERIDEX) 0.12 % solution, Rinse with 15ml for 30 seconds twice per day, after brushing and flossing teeth. DO NOT EAT OR DRINK FOR 30 MINS AFTER USE.   DULoxetine  (CYMBALTA ) 30 MG capsule, Take 1 capsule (30 mg total) by mouth daily.   gabapentin  (NEURONTIN ) 100 MG capsule, Take 2 capsules (200 mg total) by mouth 2 (two) times daily.   lidocaine  (LIDODERM ) 5 %, Place 1 patch onto the skin every 12 (twelve) hours. Remove & Discard patch within 12 hours or as directed by MD   omeprazole (PRILOSEC) 20 MG capsule, Take 20 mg by mouth daily.   Reviewed prior external information including notes and imaging from  primary care provider As well as notes that were available from care everywhere and other healthcare systems.  Past medical history, social, surgical and family history all reviewed in electronic medical record.  No pertanent information unless stated regarding to the chief complaint.   Review of Systems:  No headache, visual changes, nausea, vomiting, diarrhea, constipation, dizziness, abdominal pain, skin rash, fevers, chills, night sweats, weight loss, swollen lymph nodes, body aches, joint swelling, chest pain, shortness of breath, mood changes. POSITIVE muscle aches  Objective  Vitals:   09/23/24 0743  BP: 118/74  Pulse: 80  SpO2: 97%      General: No apparent distress alert and oriented x3 mood and affect normal, dressed appropriately.  HEENT: Pupils equal, extraocular movements intact  Respiratory: Patient's speak in full sentences and does not appear short of breath  Cardiovascular: No lower extremity edema, non tender, no erythema   Low back exam shows some loss of lordosis noted.  Some tenderness to palpation in the paraspinal musculature.  Tightness minorly.  Right shoulder does have significant difficulty with range of motion.  Positive impingement noted.  Rotator cuff strength seems to be  intact.  Positive crossover noted.  Procedure: Real-time Ultrasound Guided Injection of right acromioclavicular joint Device: GE Logiq Q7 Ultrasound guided injection is preferred based studies that show increased duration, increased effect, greater accuracy, decreased procedural pain, increased response rate, and decreased cost with ultrasound guided versus blind injection.  Verbal informed consent obtained.  Time-out conducted.  Noted no overlying erythema, induration, or other signs of local infection.  Skin prepped in a sterile fashion.  Local anesthesia: Topical Ethyl chloride.  With sterile technique and under real time ultrasound guidance: With a 25-gauge Needle injected with 0.5 cc of 0.5% Marcaine and 0.5 cc of Kenalog 40 mg/mL. Completed without difficulty  Pain immediately resolved suggesting accurate placement of the medication.  Advised  to call if fevers/chills, erythema, induration, drainage, or persistent bleeding.  Impression: Technically successful ultrasound guided injection.  Procedure: Real-time Ultrasound Guided Injection of right glenohumeral joint Device: GE Logiq Q7  Ultrasound guided injection is preferred based studies that show increased duration, increased effect, greater accuracy, decreased procedural pain, increased response rate with ultrasound guided versus blind injection.  Verbal informed consent obtained.  Time-out conducted.  Noted no overlying erythema, induration, or other signs of local infection.  Skin prepped in a sterile fashion.  Local anesthesia: Topical Ethyl chloride.  With sterile technique and under real time ultrasound guidance:  Joint visualized.  23g 1  inch needle inserted posterior approach. Pictures taken for needle placement. Patient did have injection of 2 cc of 1% lidocaine , 2 cc of 0.5% Marcaine, and 1.0 cc of Kenalog 40 mg/dL. Completed without difficulty  Pain immediately resolved suggesting accurate placement of the medication.   Advised to call if fevers/chills, erythema, induration, drainage, or persistent bleeding.  Impression: Technically successful ultrasound guided injection.   Impression and Recommendations:     The above documentation has been reviewed and is accurate and complete Fantasy Donald M Judd Mccubbin, DO

## 2024-09-23 ENCOUNTER — Other Ambulatory Visit: Payer: Self-pay

## 2024-09-23 ENCOUNTER — Ambulatory Visit: Admitting: Family Medicine

## 2024-09-23 ENCOUNTER — Encounter: Payer: Self-pay | Admitting: Family Medicine

## 2024-09-23 ENCOUNTER — Ambulatory Visit (INDEPENDENT_AMBULATORY_CARE_PROVIDER_SITE_OTHER)

## 2024-09-23 VITALS — BP 118/74 | HR 80 | Ht 65.0 in | Wt 318.0 lb

## 2024-09-23 DIAGNOSIS — M19019 Primary osteoarthritis, unspecified shoulder: Secondary | ICD-10-CM | POA: Diagnosis not present

## 2024-09-23 DIAGNOSIS — M25511 Pain in right shoulder: Secondary | ICD-10-CM | POA: Diagnosis not present

## 2024-09-23 DIAGNOSIS — M19011 Primary osteoarthritis, right shoulder: Secondary | ICD-10-CM | POA: Diagnosis not present

## 2024-09-23 DIAGNOSIS — G8929 Other chronic pain: Secondary | ICD-10-CM | POA: Diagnosis not present

## 2024-09-23 DIAGNOSIS — M25519 Pain in unspecified shoulder: Secondary | ICD-10-CM | POA: Diagnosis not present

## 2024-09-23 NOTE — Assessment & Plan Note (Signed)
 Injection given and tolerated the procedure well, discussed icing regimen and home exercises, discussed which activities to do and which ones to avoid.  Increase activity slowly.  Discussed icing regimen.  Follow-up again in 6 to 12 weeks.

## 2024-09-23 NOTE — Patient Instructions (Addendum)
 Injections in shoulder today Good to see you! Xray today Do prescribed exercises at least 3x a week  Keep Dec appointment for now

## 2024-09-23 NOTE — Assessment & Plan Note (Signed)
 Questionable labral pathology based on exam today.  Some hypoechoic changes were noted today.  To be in the glenohumeral joint.  Discussed icing regimen and home exercises, discussed which activities to do and which ones to avoid.  Increase activity slowly.  Follow-up again in 6 to 12 weeks.

## 2024-09-25 ENCOUNTER — Ambulatory Visit: Payer: Self-pay | Admitting: Family Medicine

## 2024-09-26 ENCOUNTER — Other Ambulatory Visit (HOSPITAL_COMMUNITY): Payer: Self-pay

## 2024-09-26 DIAGNOSIS — N951 Menopausal and female climacteric states: Secondary | ICD-10-CM

## 2024-09-26 MED ORDER — ESTRADIOL 0.0375 MG/24HR TD PTTW
1.0000 | MEDICATED_PATCH | TRANSDERMAL | 2 refills | Status: AC
  Filled 2024-09-26 – 2024-09-29 (×2): qty 24, 84d supply, fill #0

## 2024-09-26 NOTE — Telephone Encounter (Signed)
 Active Rx on file for estradiol  0.0375 mg patch sent on 06/19/24, #8/12 RF.   RX updated to 90 day supply with 2 RF.  90 day supply of progesterone  sent on 09/08/24, 0RF.   Patient notified via MyChart to schedule AEX for future refills.   Routing to provider for final review. Patient is agreeable to disposition. Will close encounter.

## 2024-09-29 ENCOUNTER — Other Ambulatory Visit (HOSPITAL_COMMUNITY): Payer: Self-pay

## 2024-09-29 ENCOUNTER — Other Ambulatory Visit: Payer: Self-pay

## 2024-10-06 NOTE — Progress Notes (Deleted)
 [name] [address] [place] [address] Phone: [phone] Subjective:    I'm seeing this patient by the request  of:  Mahlon Comer BRAVO, MD  CC:   YEP:Dlagzrupcz  09/23/2024 Questionable labral pathology based on exam today.  Some hypoechoic changes were noted today.  To be in the glenohumeral joint.  Discussed icing regimen and home exercises, discussed which activities to do and which ones to avoid.  Increase activity slowly.  Follow-up again in 6 to 12 weeks.     Injection given and tolerated the procedure well, discussed icing regimen and home exercises, discussed which activities to do and which ones to avoid.  Increase activity slowly.  Discussed icing regimen.  Follow-up again in 6 to 12 weeks.      Update 10/07/2024 Emily Craig is a 53 y.o. female coming in with complaint of R shoulder pain. Patient states        Past Medical History:  Diagnosis Date   Arthritis    hands   Hypertension    Thyroid  disease    Past Surgical History:  Procedure Laterality Date   CHOLECYSTECTOMY     TONSILLECTOMY AND ADENOIDECTOMY     Social History   Socioeconomic History   Marital status: Married    Spouse name: Darina   Number of children: 3   Years of education: RN   Highest education level: Not on file  Occupational History   Occupation: Teacher, Adult Education: [place]    Comment: [employer]  Tobacco Use   Smoking status: Never    Passive exposure: Past   Smokeless tobacco: Never  Vaping Use   Vaping status: Never Used  Substance and Sexual Activity   Alcohol use: Yes    Alcohol/week: 0.0 standard drinks of alcohol    Comment: rarely   Drug use: No   Sexual activity: Yes    Partners: Male    Birth control/protection: None    Comment: menarche 53yo, sexual debut 53yo  Other Topics Concern   Not on file  Social History Narrative   Lives with her husband and their daughter.  Their sons (twins) stayed in [place]  when they moved here June 2014.   Social Drivers of Corporate Investment Banker Strain: Not on file  Food Insecurity: Not on file  Transportation Needs: Not on file  Physical Activity: Not on file  Stress: Not on file  Social Connections: Not on file   Allergies  Allergen Reactions   Maxzide [Triamterene -Hctz] Swelling   Penicillins     Allergy since childhood/unknown reaction   Shrimp Extract     Eats shrimp all the time per pt   Sulfa Antibiotics Rash   Family History  Problem Relation Age of Onset   Hypertension Mother    Aortic aneurysm Mother    Cancer Mother        pancreatic cancer   Pancreatic cancer Mother    Heart disease Father    Hyperlipidemia Brother    Hypertension Brother    Diabetes Maternal Grandmother    Heart disease Maternal Grandmother    Cancer Maternal Grandmother        reproductive    Liver disease Maternal Grandfather    Stroke Paternal Grandmother    Breast cancer Cousin 50       paternal side    Colon cancer Neg Hx    Esophageal cancer Neg Hx    Stomach cancer Neg Hx  Rectal cancer Neg Hx     Current Outpatient Medications (Endocrine & Metabolic):    estradiol  (VIVELLE -DOT) 0.0375 MG/24HR, Place 1 patch onto the skin 2 (two) times a week.   levothyroxine  (SYNTHROID ) 175 MCG tablet, Take 1 tablet (175 mcg total) by mouth daily before breakfast.   progesterone  (PROMETRIUM ) 100 MG capsule, Take 1 capsule (100 mg total) by mouth daily.  Current Outpatient Medications (Cardiovascular):    spironolactone  (ALDACTONE ) 50 MG tablet, Take 1 tablet (50 mg total) by mouth daily.  Current Outpatient Medications (Respiratory):    cetirizine  (ZYRTEC ) 10 MG tablet, Take 10 mg by mouth daily.    Current Outpatient Medications (Other):    chlorhexidine  (PERIDEX ) 0.12 % solution, Rinse with 15ml for 30 seconds twice per day, after brushing and flossing teeth. DO NOT EAT OR DRINK FOR 30 MINS AFTER USE.   DULoxetine  (CYMBALTA ) 30 MG capsule, Take 1  capsule (30 mg total) by mouth daily.   gabapentin  (NEURONTIN ) 100 MG capsule, Take 2 capsules (200 mg total) by mouth 2 (two) times daily.   lidocaine  (LIDODERM ) 5 %, Place 1 patch onto the skin every 12 (twelve) hours. Remove & Discard patch within 12 hours or as directed by MD   omeprazole (PRILOSEC) 20 MG capsule, Take 20 mg by mouth daily.   Vitamin D , Ergocalciferol , (DRISDOL ) 1.25 MG (50000 UNIT) CAPS capsule, Take 1 capsule (50,000 Units total) by mouth every 7 (seven) days.   Reviewed prior external information including notes and imaging from  primary care provider As well as notes that were available from care everywhere and other healthcare systems.  Past medical history, social, surgical and family history all reviewed in electronic medical record.  No pertanent information unless stated regarding to the chief complaint.   Review of Systems:  No headache, visual changes, nausea, vomiting, diarrhea, constipation, dizziness, abdominal pain, skin rash, fevers, chills, night sweats, weight loss, swollen lymph nodes, body aches, joint swelling, chest pain, shortness of breath, mood changes. POSITIVE muscle aches  Objective  There were no vitals taken for this visit.   General: No apparent distress alert and oriented x3 mood and affect normal, dressed appropriately.  HEENT: Pupils equal, extraocular movements intact  Respiratory: Patient's speak in full sentences and does not appear short of breath  Cardiovascular: No lower extremity edema, non tender, no erythema      Impression and Recommendations:

## 2024-10-07 ENCOUNTER — Ambulatory Visit: Admitting: Family Medicine

## 2024-10-07 ENCOUNTER — Other Ambulatory Visit: Payer: Self-pay

## 2024-10-07 ENCOUNTER — Other Ambulatory Visit (HOSPITAL_COMMUNITY): Payer: Self-pay

## 2024-10-07 MED ORDER — DULOXETINE HCL 20 MG PO CPEP
20.0000 mg | ORAL_CAPSULE | Freq: Every day | ORAL | 0 refills | Status: AC
  Filled 2024-10-07: qty 90, 90d supply, fill #0

## 2024-10-10 ENCOUNTER — Other Ambulatory Visit (HOSPITAL_BASED_OUTPATIENT_CLINIC_OR_DEPARTMENT_OTHER): Payer: Self-pay

## 2024-10-13 ENCOUNTER — Other Ambulatory Visit: Payer: Self-pay

## 2024-10-13 ENCOUNTER — Other Ambulatory Visit (HOSPITAL_BASED_OUTPATIENT_CLINIC_OR_DEPARTMENT_OTHER): Payer: Self-pay

## 2024-10-25 DIAGNOSIS — G4733 Obstructive sleep apnea (adult) (pediatric): Secondary | ICD-10-CM | POA: Diagnosis not present

## 2024-10-27 ENCOUNTER — Encounter (HOSPITAL_BASED_OUTPATIENT_CLINIC_OR_DEPARTMENT_OTHER): Payer: Self-pay

## 2024-10-27 ENCOUNTER — Ambulatory Visit (HOSPITAL_BASED_OUTPATIENT_CLINIC_OR_DEPARTMENT_OTHER)

## 2024-10-27 VITALS — BP 130/85 | HR 61 | Ht 65.0 in | Wt 319.0 lb

## 2024-10-27 DIAGNOSIS — G4733 Obstructive sleep apnea (adult) (pediatric): Secondary | ICD-10-CM

## 2024-10-27 NOTE — Patient Instructions (Signed)
 Continue CPAP therapy; goal of wearing the machine for 4-6 hours nightly or more.  Plan for follow up in 6 months.  Order placed today for new CPAP supplies.

## 2024-10-27 NOTE — Progress Notes (Signed)
 "  @Patient  ID: Emily Craig, female    DOB: 1971-01-03, 53 y.o.   MRN: [id number]  Chief Complaint  Patient presents with   Follow-up    Referring provider: Mahlon Comer BRAVO, MD  HPI: Discussed the use of AI scribe software for clinical note transcription with the patient, who gave verbal consent to proceed.  History of Present Illness Emily Craig is a 53 year old female with sleep apnea who presents for a follow-up visit.  She has been using her CPAP device nightly since August 28, 2024, experiencing significant improvement in cognitive function, including better concentration and reduced brain fog. She feels more alert and able to focus on tasks, describing it as a return to her previous level of functioning.  She uses nasal pillows with her CPAP and mostly sleeps on her back. Attempts to sleep on her side have caused nasal soreness and increased noise due to displacement of the pillows. She wears the device for six to seven hours per night, which meets the minimum recommended usage of four to six hours.  Her sleep events have decreased from 32.5 events per hour to 1.7 events per hour.   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 She was referred by Dr. Zach Smith for evaluation of suspected sleep apnea.   She experiences severe leg cramps at night, affecting her thighs, feet, ankles, and shins. Her husband observes snoring without apneic episodes. Snoring resolved with weight loss but returned with weight gain. She sometimes wakes with frontal headaches and has variable sleep quality. Energy levels remain good during the day without significant fatigue or need for naps. She maintains an active lifestyle and does not feel excessively tired after meals. Bedtime varies between 10 PM and 12 AM, with wake-up around 5:30 AM. Earlier waking improves daytime well-being. Hypertension is  managed with Sporanox, and blood pressure is usually well-controlled. She takes thyroid  medication, with past normal thyroid  function tests.   ESS 7. Bedtime 10 PM to midnight, sleep latency up to 1 hour, sleeps on her side, 0-3 nocturnal awakenings, wakes up at 5:30 AM on workdays and 8 AM on weekends Gained 30 pounds in the last 6 months 2 to 3 cups of coffee daily    TEST/EVENTS :   HST 06/18/2024: Severe OSA AHI 32.5/hr with O2 sat nadir 73%  Allergies[1]  Immunization History  Administered Date(s) Administered   Influenza,inj,Quad PF,6+ Mos 08/22/2015, 08/11/2016, 08/27/2019   Influenza-Unspecified 08/19/2017, 08/25/2022   Tdap 08/18/2012    Past Medical History:  Diagnosis Date   Arthritis    hands   Hypertension    Thyroid  disease     Tobacco History: Tobacco Use History[2] Counseling given: Not Answered   Outpatient Medications Prior to Visit  Medication Sig Dispense Refill   cetirizine  (ZYRTEC ) 10 MG tablet Take 10 mg by mouth daily.     chlorhexidine  (PERIDEX ) 0.12 % solution Rinse with 15ml for 30 seconds twice per day, after brushing and flossing teeth. DO NOT EAT OR DRINK FOR 30 MINS AFTER USE. 473 mL 3   DULoxetine  (CYMBALTA ) 20 MG capsule Take 1 capsule (20 mg total) by mouth daily. 90 capsule 0   DULoxetine  (CYMBALTA ) 30 MG capsule Take 1 capsule (30 mg total) by mouth daily. 90 capsule 1   estradiol  (VIVELLE -DOT) 0.0375 MG/24HR Place 1 patch onto the skin 2 (two) times a week. 24 patch 2   gabapentin  (  NEURONTIN ) 100 MG capsule Take 2 capsules (200 mg total) by mouth 2 (two) times daily. 180 capsule 0   levothyroxine  (SYNTHROID ) 175 MCG tablet Take 1 tablet (175 mcg total) by mouth daily before breakfast. 90 tablet 1   lidocaine  (LIDODERM ) 5 % Place 1 patch onto the skin every 12 (twelve) hours. Remove & Discard patch within 12 hours or as directed by MD 60 patch 0   omeprazole (PRILOSEC) 20 MG capsule Take 20 mg by mouth daily.     progesterone   (PROMETRIUM ) 100 MG capsule Take 1 capsule (100 mg total) by mouth daily. 90 capsule 0   spironolactone  (ALDACTONE ) 50 MG tablet Take 1 tablet (50 mg total) by mouth daily. 90 tablet 1   Vitamin D , Ergocalciferol , (DRISDOL ) 1.25 MG (50000 UNIT) CAPS capsule Take 1 capsule (50,000 Units total) by mouth every 7 (seven) days. 12 capsule 0   No facility-administered medications prior to visit.     Review of Systems: as per hpi  Constitutional:   No  weight loss, night sweats,  Fevers, chills, fatigue, or  lassitude.  HEENT:   No headaches,  Difficulty swallowing,  Tooth/dental problems, or  Sore throat,                No sneezing, itching, ear ache, nasal congestion, post nasal drip,   CV:  No chest pain,  Orthopnea, PND, swelling in lower extremities, anasarca, dizziness, palpitations, syncope.   GI  No heartburn, indigestion, abdominal pain, nausea, vomiting, diarrhea, change in bowel habits, loss of appetite, bloody stools.   Resp: No shortness of breath with exertion or at rest.  No excess mucus, no productive cough,  No non-productive cough,  No coughing up of blood.  No change in color of mucus.  No wheezing.  No chest wall deformity  Skin: no rash or lesions.  GU: no dysuria, change in color of urine, no urgency or frequency.  No flank pain, no hematuria   MS:  No joint pain or swelling.  No decreased range of motion.  No back pain.    Physical Exam  BP 130/85   Pulse 61   Ht 5' 5 (1.651 m)   Wt (!) 319 lb (144.7 kg)   SpO2 99%   BMI 53.08 kg/m   GEN: A/Ox3; pleasant , NAD, well nourished    HEENT:  [place]/AT,  EACs-clear, TMs-wnl, NOSE-clear, THROAT-clear, no lesions, no postnasal drip or exudate noted.   NECK:  Supple w/ fair ROM; no JVD; normal carotid impulses w/o bruits; no thyromegaly or nodules palpated; no lymphadenopathy.    RESP  Clear  P & A; w/o, wheezes/ rales/ or rhonchi. no accessory muscle use, no dullness to percussion  CARD:  RRR, no m/r/g, no  peripheral edema, pulses intact, no cyanosis or clubbing.  GI:   obese, soft & nt; nml bowel sounds; no organomegaly or masses detected.   Musco: Warm bil, no deformities or joint swelling noted.   Neuro: alert, no focal deficits noted.    Skin: Warm, no lesions or rashes    Lab Results:  CBC    Component Value Date/Time   WBC 5.8 09/02/2024 0920   RBC 4.70 09/02/2024 0920   HGB 12.4 09/02/2024 0920   HGB 11.0 (L) 06/03/2018 1705   HCT 39.0 09/02/2024 0920   HCT 35.1 06/03/2018 1705   PLT 246.0 09/02/2024 0920   PLT 326 06/03/2018 1705   MCV 83.0 09/02/2024 0920   MCV 82 06/03/2018 1705  MCH 25.7 (L) 06/03/2018 1705   MCHC 31.8 09/02/2024 0920   RDW 13.9 09/02/2024 0920   RDW 15.3 06/03/2018 1705   LYMPHSABS 1.7 09/02/2024 0920   LYMPHSABS 3.6 (H) 06/03/2018 1705   MONOABS 0.5 09/02/2024 0920   EOSABS 0.1 09/02/2024 0920   EOSABS 0.2 06/03/2018 1705   BASOSABS 0.0 09/02/2024 0920   BASOSABS 0.0 06/03/2018 1705    BMET    Component Value Date/Time   NA 137 09/02/2024 0920   NA 135 09/03/2018 1313   K 4.3 09/02/2024 0920   CL 102 09/02/2024 0920   CO2 26 09/02/2024 0920   GLUCOSE 90 09/02/2024 0920   BUN 16 09/02/2024 0920   BUN 10 09/03/2018 1313   CREATININE 0.81 09/02/2024 0920   CALCIUM 9.0 09/02/2024 0920   GFRNONAA 83 09/03/2018 1313   GFRAA 96 09/03/2018 1313    BNP No results found for: BNP  ProBNP No results found for: PROBNP  Imaging: No results found.  Administration History     None           No data to display          No results found for: NITRICOXIDE   Assessment & Plan:   Assessment & Plan OSA (obstructive sleep apnea)  Assessment and Plan Assessment & Plan Obstructive sleep apnea Severe obstructive sleep apnea with significant symptom improvement on CPAP. Apnea-hypopnea index reduced from 32.5 to 1.7 events per hour. CPAP usage averages 6-7 hours per night. Occasional mask leaks noted but overall leak  profile is acceptable. - Continue CPAP therapy for at least 4-6 hours per night. - Encouraged sleep hygiene practices, including consistent sleep schedule, avoiding screens before bed, and avoiding alcohol and caffeine late in the day. - Ordered CPAP supplies; including nasal pillows and filters. - Scheduled follow-up in 6 months, with the option to return sooner if experiencing issues with the CPAP machine or sleep quality.    Return for with Dr. Jude; sleep.  Candis Dandy, PA-C 10/27/2024      [1]  Allergies Allergen Reactions   Maxzide [Triamterene -Hctz] Swelling   Penicillins     Allergy since childhood/unknown reaction   Shrimp Extract     Eats shrimp all the time per pt   Sulfa Antibiotics Rash  [2]  Social History Tobacco Use  Smoking Status Never   Passive exposure: Past  Smokeless Tobacco Never   "

## 2024-11-03 ENCOUNTER — Other Ambulatory Visit: Payer: Self-pay | Admitting: Radiology

## 2024-11-03 DIAGNOSIS — Z1231 Encounter for screening mammogram for malignant neoplasm of breast: Secondary | ICD-10-CM

## 2024-11-05 DIAGNOSIS — N951 Menopausal and female climacteric states: Secondary | ICD-10-CM

## 2024-11-05 MED ORDER — PROGESTERONE MICRONIZED 100 MG PO CAPS
200.0000 mg | ORAL_CAPSULE | Freq: Every evening | ORAL | 0 refills | Status: AC
  Filled 2024-12-11: qty 60, 30d supply, fill #0

## 2024-11-05 NOTE — Telephone Encounter (Signed)
 Medication refill request: progesterone  100mg  Last AEX:  11-29-23 Next AEX: 12-17-24 Last MMG (if hormonal medication request): 11-29-23 birads 1:neg Refill authorized: please review rx and make any adjustments in directions.

## 2024-11-17 ENCOUNTER — Other Ambulatory Visit (HOSPITAL_BASED_OUTPATIENT_CLINIC_OR_DEPARTMENT_OTHER): Payer: Self-pay

## 2024-11-28 ENCOUNTER — Other Ambulatory Visit (HOSPITAL_BASED_OUTPATIENT_CLINIC_OR_DEPARTMENT_OTHER): Payer: Self-pay

## 2024-11-28 NOTE — Progress Notes (Unsigned)
 " [name] [address] [place] [address] Phone: [phone] Subjective:    I'm seeing this patient by the request  of:  Mahlon Comer BRAVO, MD  CC:   YEP:Dlagzrupcz  09/23/2024 Questionable labral pathology based on exam today.  Some hypoechoic changes were noted today.  To be in the glenohumeral joint.  Discussed icing regimen and home exercises, discussed which activities to do and which ones to avoid.  Increase activity slowly.  Follow-up again in 6 to 12 weeks.    Injection given and tolerated the procedure well, discussed icing regimen and home exercises, discussed which activities to do and which ones to avoid.  Increase activity slowly.  Discussed icing regimen.  Follow-up again in 6 to 12 weeks.      Update 12/03/2024 Beulah Capobianco is a 54 y.o. female coming in with complaint of R shoulder pain. 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  Their sons (twins) stayed in [place]  when they moved here June 2014.   Social Drivers of Health   Tobacco Use: Low Risk (10/27/2024)   Patient History    Smoking Tobacco Use: Never    Smokeless Tobacco Use: Never    Passive Exposure: Past  Financial Resource Strain: Not on file  Food Insecurity: Not on file  Transportation Needs: Not on file  Physical Activity: Not on file  Stress: Not on file  Social Connections: Not on file  Depression (PHQ2-9): Low Risk (09/02/2024)   Depression (PHQ2-9)    PHQ-2 Score: 0  Alcohol Screen: Not on file  Housing: Not on file  Utilities: Not on file  Health Literacy: Not on file   Allergies[1] Family History  Problem Relation Age of Onset   Hypertension Mother    Aortic aneurysm Mother    Cancer Mother        pancreatic cancer   Pancreatic cancer Mother    Heart disease Father    Hyperlipidemia Brother    Hypertension Brother    Diabetes Maternal Grandmother    Heart disease Maternal Grandmother    Cancer Maternal Grandmother        reproductive    Liver disease Maternal Grandfather    Stroke Paternal Grandmother    Breast cancer Cousin 50  paternal side    Colon cancer Neg Hx    Esophageal cancer Neg Hx    Stomach cancer Neg Hx    Rectal cancer Neg Hx     Current Outpatient Medications (Endocrine & Metabolic):    estradiol  (VIVELLE -DOT) 0.0375 MG/24HR, Place 1 patch onto the skin 2 (two) times a week.   levothyroxine  (SYNTHROID ) 175 MCG tablet, Take 1 tablet (175 mcg total) by mouth daily before breakfast.   progesterone  (PROMETRIUM ) 100 MG capsule, Take 2 capsules (200 mg total) by mouth at bedtime.  Current Outpatient Medications (Cardiovascular):    spironolactone  (ALDACTONE ) 50 MG tablet, Take 1 tablet (50 mg total) by mouth daily.  Current Outpatient Medications (Respiratory):    cetirizine  (ZYRTEC ) 10 MG tablet, Take 10 mg by mouth daily.  Current Outpatient Medications (Other):    chlorhexidine  (PERIDEX ) 0.12 % solution, Rinse with 15ml for 30  seconds twice per day, after brushing and flossing teeth. DO NOT EAT OR DRINK FOR 30 MINS AFTER USE.   DULoxetine  (CYMBALTA ) 20 MG capsule, Take 1 capsule (20 mg total) by mouth daily.   DULoxetine  (CYMBALTA ) 30 MG capsule, Take 1 capsule (30 mg total) by mouth daily.   gabapentin  (NEURONTIN ) 100 MG capsule, Take 2 capsules (200 mg total) by mouth 2 (two) times daily.   lidocaine  (LIDODERM ) 5 %, Place 1 patch onto the skin every 12 (twelve) hours. Remove & Discard patch within 12 hours or as directed by MD   omeprazole (PRILOSEC) 20 MG capsule, Take 20 mg by mouth daily.   Vitamin D , Ergocalciferol , (DRISDOL ) 1.25 MG (50000 UNIT) CAPS capsule, Take 1 capsule (50,000 Units total) by mouth every 7 (seven) days.   Reviewed prior external information including notes and imaging from  primary care provider As well as notes that were available from care everywhere and other healthcare systems.  Past medical history, social, surgical and family history all reviewed in electronic medical record.  No pertanent information unless stated regarding to the chief complaint.   Review of Systems:  No headache, visual changes, nausea, vomiting, diarrhea, constipation, dizziness, abdominal pain, skin rash, fevers, chills, night sweats, weight loss, swollen lymph nodes, body aches, joint swelling, chest pain, shortness of breath, mood changes. POSITIVE muscle aches  Objective  There were no vitals taken for this visit.   General: No apparent distress alert and oriented x3 mood and affect normal, dressed appropriately.  HEENT: Pupils equal, extraocular movements intact  Respiratory: Patient's speak in full sentences and does not appear short of breath  Cardiovascular: No lower extremity edema, non tender, no erythema      Impression and Recommendations:           [1]  Allergies Allergen Reactions   Maxzide [Triamterene -Hctz] Swelling   Penicillins     Allergy since childhood/unknown reaction    Shrimp Extract     Eats shrimp all the time per pt   Sulfa Antibiotics Rash   "

## 2024-12-02 ENCOUNTER — Encounter: Payer: Self-pay | Admitting: Family Medicine

## 2024-12-03 ENCOUNTER — Ambulatory Visit

## 2024-12-03 ENCOUNTER — Ambulatory Visit: Admitting: Family Medicine

## 2024-12-03 DIAGNOSIS — Z1231 Encounter for screening mammogram for malignant neoplasm of breast: Secondary | ICD-10-CM

## 2024-12-04 ENCOUNTER — Other Ambulatory Visit (HOSPITAL_BASED_OUTPATIENT_CLINIC_OR_DEPARTMENT_OTHER): Payer: Self-pay

## 2024-12-08 ENCOUNTER — Other Ambulatory Visit (HOSPITAL_BASED_OUTPATIENT_CLINIC_OR_DEPARTMENT_OTHER): Payer: Self-pay

## 2024-12-11 ENCOUNTER — Other Ambulatory Visit (HOSPITAL_BASED_OUTPATIENT_CLINIC_OR_DEPARTMENT_OTHER): Payer: Self-pay

## 2024-12-17 ENCOUNTER — Ambulatory Visit: Admitting: Radiology
